# Patient Record
Sex: Female | Born: 1995
Health system: Southern US, Community
[De-identification: ages and names within clinical notes are randomized; demographics above are authoritative.]

## PROBLEM LIST (undated history)

## (undated) DIAGNOSIS — I10 Essential (primary) hypertension: Secondary | ICD-10-CM

## (undated) DIAGNOSIS — J302 Other seasonal allergic rhinitis: Secondary | ICD-10-CM

## (undated) DIAGNOSIS — J45909 Unspecified asthma, uncomplicated: Secondary | ICD-10-CM

## (undated) DIAGNOSIS — E669 Obesity, unspecified: Secondary | ICD-10-CM

---
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
## 2015-02-14 ENCOUNTER — Emergency Department (HOSPITAL_BASED_OUTPATIENT_CLINIC_OR_DEPARTMENT_OTHER)
Admission: EM | Admit: 2015-02-14 | Discharge: 2015-02-14 | Disposition: A | Discharge status: Home / Self Care | Payer: Medicaid Other | Attending: Emergency Medicine | Admitting: Emergency Medicine

## 2015-02-14 ENCOUNTER — Encounter (HOSPITAL_BASED_OUTPATIENT_CLINIC_OR_DEPARTMENT_OTHER): Payer: Self-pay

## 2015-02-14 DIAGNOSIS — E669 Obesity, unspecified: Secondary | ICD-10-CM | POA: Insufficient documentation

## 2015-02-14 DIAGNOSIS — Z792 Long term (current) use of antibiotics: Secondary | ICD-10-CM | POA: Insufficient documentation

## 2015-02-14 DIAGNOSIS — Z79899 Other long term (current) drug therapy: Secondary | ICD-10-CM | POA: Diagnosis not present

## 2015-02-14 DIAGNOSIS — M549 Dorsalgia, unspecified: Secondary | ICD-10-CM | POA: Diagnosis present

## 2015-02-14 DIAGNOSIS — J45909 Unspecified asthma, uncomplicated: Secondary | ICD-10-CM | POA: Insufficient documentation

## 2015-02-14 DIAGNOSIS — M25511 Pain in right shoulder: Secondary | ICD-10-CM | POA: Diagnosis not present

## 2015-02-14 DIAGNOSIS — M542 Cervicalgia: Secondary | ICD-10-CM | POA: Insufficient documentation

## 2015-02-14 DIAGNOSIS — M791 Myalgia, unspecified site: Secondary | ICD-10-CM

## 2015-02-14 MED ORDER — IBUPROFEN 800 MG PO TABS
800.0000 mg | ORAL_TABLET | Freq: Once | ORAL | Status: AC
  Administered 2015-02-14: 800 mg via ORAL
  Filled 2015-02-14: qty 1

## 2015-02-14 MED ORDER — METHOCARBAMOL 500 MG PO TABS: 500.0000 mg | ORAL_TABLET | Freq: Three times a day (TID) | ORAL | Status: DC | PRN

## 2015-02-14 MED ORDER — TRAMADOL HCL 50 MG PO TABS: 50.0000 mg | ORAL_TABLET | Freq: Four times a day (QID) | ORAL | Status: DC | PRN

## 2015-02-14 MED ORDER — CYCLOBENZAPRINE HCL 10 MG PO TABS
5.0000 mg | ORAL_TABLET | Freq: Once | ORAL | Status: AC
  Administered 2015-02-14: 5 mg via ORAL
  Filled 2015-02-14: qty 1

## 2015-02-14 MED ORDER — NAPROXEN 500 MG PO TABS: 500.0000 mg | ORAL_TABLET | Freq: Two times a day (BID) | ORAL | Status: DC

## 2015-02-14 NOTE — Discharge Instructions (Signed)
Your exam suggests some muscle pain in your neck and shoulder.  Prescriptions for anti-inflammatory and muscle relaxants.  Apply ice over a thin towel for 20-30 minutes on the sore spots after activity that worsens your symptoms  Tramadol, pain medicine to take at home at night. Do not take if working or driving.  Okay to continue current work. Recheck with your physician, or occupational health through your employer if your symptoms persist or worsen.

## 2015-02-14 NOTE — ED Notes (Signed)
Patient has taken tylenol and advil but no relief, used icy hot

## 2015-02-14 NOTE — ED Notes (Signed)
C/o upper back pain x 2 days-denies injury-steady gait to triage-NAD

## 2015-02-14 NOTE — ED Provider Notes (Signed)
CSN: [id number]     Arrival date & time 02/14/15  1110 History   First MD Initiated Contact with Patient 02/14/15 1120     Chief Complaint  Patient presents with  . Back Pain      HPI  Patient presents for evaluation of neck and shoulder pain. Started doing a new job at [employer] a week ago. Her right shoulder and neck are sore after work she presents here. No history of chronic neck or back pain. No fall injury or trauma. She is working as a Conservation officer, nature.  Past Medical History  Diagnosis Date  . Asthma   . Seasonal allergies   . Obesity    History reviewed. No pertinent past surgical history. No family history on file. Social History  Substance Use Topics  . Smoking status: Never Smoker   . Smokeless tobacco: None  . Alcohol Use: No   OB History    No data available     Review of Systems  Constitutional: Negative for fever, chills, diaphoresis, appetite change and fatigue.  HENT: Negative for mouth sores, sore throat and trouble swallowing.   Eyes: Negative for visual disturbance.  Respiratory: Negative for cough, chest tightness, shortness of breath and wheezing.   Cardiovascular: Negative for chest pain.  Gastrointestinal: Negative for nausea, vomiting, abdominal pain, diarrhea and abdominal distention.  Endocrine: Negative for polydipsia, polyphagia and polyuria.  Genitourinary: Negative for dysuria, frequency and hematuria.  Musculoskeletal: Negative for gait problem.       Right neck and shoulder pain.  Skin: Negative for color change, pallor and rash.  Neurological: Negative for dizziness, syncope, light-headedness and headaches.  Hematological: Does not bruise/bleed easily.  Psychiatric/Behavioral: Negative for behavioral problems and confusion.      Allergies  Augmentin  Home Medications   Prior to Admission medications   Medication Sig Start Date End Date Taking? Authorizing Provider  acetaminophen-codeine (TYLENOL #3) 300-30 MG per tablet Take 1 tablet by  mouth every 4 (four) hours as needed. For pain    Historical Provider, MD  albuterol (PROVENTIL HFA;VENTOLIN HFA) 108 (90 BASE) MCG/ACT inhaler Inhale 2 puffs into the lungs every 6 (six) hours as needed. For shortness of breath and wheezing    Historical Provider, MD  azithromycin (ZITHROMAX Z-PAK) 250 MG tablet Take 1 tablet (250 mg total) by mouth daily. 04/25/13   Fayrene Helper, PA-C  diphenhydrAMINE (BENADRYL) 25 mg capsule Take 1 capsule (25 mg total) by mouth every 6 (six) hours as needed (take with compazine for migraine headache). 02/11/13   John-Adam Bonk, MD  famotidine (PEPCID) 20 MG tablet Take 1 tablet (20 mg total) by mouth 2 (two) times daily. 07/12/11 07/11/12  Vanetta Mulders, MD  famotidine (PEPCID) 20 MG tablet Take 1 tablet (20 mg total) by mouth 2 (two) times daily. 10/06/12   Carleene Cooper, MD  fexofenadine (ALLEGRA) 180 MG tablet Take 180 mg by mouth daily.    Historical Provider, MD  hydrochlorothiazide (HYDRODIURIL) 25 MG tablet Take 1 tablet (25 mg total) by mouth daily. 10/06/12   Carleene Cooper, MD  ibuprofen (ADVIL,MOTRIN) 600 MG tablet Take 1 tablet (600 mg total) by mouth every 6 (six) hours as needed for pain. 08/23/12   [name], MD  ibuprofen (ADVIL,MOTRIN) 600 MG tablet Take 1 tablet (600 mg total) by mouth every 6 (six) hours as needed for pain. 02/11/13   John-Adam Bonk, MD  lidocaine (XYLOCAINE) 2 % solution Take 20 mLs by mouth as needed for pain. 08/23/12   Ankit  Rhunette Croft, MD  loperamide (IMODIUM) 2 MG capsule Take 4 mg by mouth daily.    Historical Provider, MD  methocarbamol (ROBAXIN) 500 MG tablet Take 1 tablet (500 mg total) by mouth 3 (three) times daily between meals as needed. 02/14/15   Rolland Porter, MD  naproxen (NAPROSYN) 500 MG tablet Take 1 tablet (500 mg total) by mouth 2 (two) times daily. 02/14/15   Rolland Porter, MD  oxyCODONE-acetaminophen (PERCOCET/ROXICET) 5-325 MG per tablet Take 1 tablet by mouth every 6 (six) hours as needed for moderate pain or severe  pain. 06/21/13   Purvis Sheffield, MD  predniSONE (DELTASONE) 20 MG tablet 3 tabs po day one, then 2 tabs daily x 4 days 04/25/13   Fayrene Helper, PA-C  prochlorperazine (COMPAZINE) 10 MG tablet Take 1 tablet (10 mg total) by mouth 2 (two) times daily as needed (for migraine headache). 02/11/13   John-Adam Bonk, MD  promethazine (PHENERGAN) 25 MG tablet Take 25 mg by mouth every 6 (six) hours as needed. For nausea    Historical Provider, MD  ranitidine (ZANTAC) 150 MG capsule Take 1 capsule (150 mg total) by mouth daily. 06/21/13   Purvis Sheffield, MD  traMADol (ULTRAM) 50 MG tablet Take 1 tablet (50 mg total) by mouth every 6 (six) hours as needed. 02/14/15   Rolland Porter, MD   BP 157/112 mmHg  Pulse 75  Temp(Src) 98.4 F (36.9 C) (Oral)  Resp 20  Ht  (1.651 m)  Wt 336 lb (152.409 kg)  BMI 55.91 kg/m2  SpO2 99% Physical Exam  Constitutional: She is oriented to person, place, and time. She appears well-developed and well-nourished. No distress.  HENT:  Head: Normocephalic.  Eyes: Conjunctivae are normal. Pupils are equal, round, and reactive to light. No scleral icterus.  Neck: Normal range of motion. Neck supple. No thyromegaly present.  Cardiovascular: Normal rate and regular rhythm.  Exam reveals no gallop and no friction rub.   No murmur heard. Pulmonary/Chest: Effort normal and breath sounds normal. No respiratory distress. She has no wheezes. She has no rales.  Abdominal: Soft. Bowel sounds are normal. She exhibits no distension. There is no tenderness. There is no rebound.  Musculoskeletal: Normal range of motion.       Arms: Tenderness along the supraspinatus fossa. No pain over the rotator cuff tendons. With internal and external rotation or symptoms reproduced in the musculature, but not tendons.  She has some pain with trapezius function.  Neurological: She is alert and oriented to person, place, and time.  Normal strength and sensation to the bilateral upper extremities. No  radicular pattern to her pain. Normal sensation distally. Normal capillary refill. Does not lose her pulse with overhead lifting of the right arm.  Skin: Skin is warm and dry. No rash noted.  Psychiatric: She has a normal mood and affect. Her behavior is normal.    ED Course  Procedures (including critical care time) Labs Review Labs Reviewed - No data to display  Imaging Review No results found. I have personally reviewed and evaluated these images and lab results as part of my medical decision-making.   EKG Interpretation None      MDM   Final diagnoses:  Muscular pain    Plan is ice, naproxen, Robaxin. I encouraged her to continue working. Ice at the end of the day over a thin towel with painful. She is employed at [employer].  I have asked her to talk with her supervisor if her symptoms persist regarding occupational  health or retraining.    Rolland Porter, MD 02/14/15 1145

## 2015-03-01 ENCOUNTER — Encounter (HOSPITAL_BASED_OUTPATIENT_CLINIC_OR_DEPARTMENT_OTHER): Payer: Self-pay | Admitting: *Deleted

## 2015-03-01 ENCOUNTER — Emergency Department (HOSPITAL_BASED_OUTPATIENT_CLINIC_OR_DEPARTMENT_OTHER): Payer: Medicaid Other

## 2015-03-01 ENCOUNTER — Emergency Department (HOSPITAL_BASED_OUTPATIENT_CLINIC_OR_DEPARTMENT_OTHER)
Admission: EM | Admit: 2015-03-01 | Discharge: 2015-03-01 | Disposition: A | Discharge status: Home / Self Care | Payer: Medicaid Other | Attending: Emergency Medicine | Admitting: Emergency Medicine

## 2015-03-01 DIAGNOSIS — R1110 Vomiting, unspecified: Secondary | ICD-10-CM | POA: Diagnosis not present

## 2015-03-01 DIAGNOSIS — Z79899 Other long term (current) drug therapy: Secondary | ICD-10-CM | POA: Insufficient documentation

## 2015-03-01 DIAGNOSIS — J45909 Unspecified asthma, uncomplicated: Secondary | ICD-10-CM | POA: Diagnosis not present

## 2015-03-01 DIAGNOSIS — R1033 Periumbilical pain: Secondary | ICD-10-CM | POA: Insufficient documentation

## 2015-03-01 DIAGNOSIS — R109 Unspecified abdominal pain: Secondary | ICD-10-CM | POA: Diagnosis present

## 2015-03-01 LAB — CBC WITH DIFFERENTIAL/PLATELET
BASOS ABS: 0 10*3/uL (ref 0.0–0.1)
BASOS PCT: 0 % (ref 0–1)
EOS ABS: 0.2 10*3/uL (ref 0.0–0.7)
EOS PCT: 3 % (ref 0–5)
HCT: 41.2 % (ref 36.0–46.0)
Hemoglobin: 14 g/dL (ref 12.0–15.0)
Lymphocytes Relative: 24 % (ref 12–46)
Lymphs Abs: 2.2 10*3/uL (ref 0.7–4.0)
MCH: 29 pg (ref 26.0–34.0)
MCHC: 34 g/dL (ref 30.0–36.0)
MCV: 85.5 fL (ref 78.0–100.0)
Monocytes Absolute: 0.6 10*3/uL (ref 0.1–1.0)
Monocytes Relative: 7 % (ref 3–12)
NEUTROS PCT: 66 % (ref 43–77)
Neutro Abs: 5.9 10*3/uL (ref 1.7–7.7)
PLATELETS: 260 10*3/uL (ref 150–400)
RBC: 4.82 MIL/uL (ref 3.87–5.11)
RDW: 12 % (ref 11.5–15.5)
WBC: 8.9 10*3/uL (ref 4.0–10.5)

## 2015-03-01 LAB — LIPASE, BLOOD: LIPASE: 20 U/L — AB (ref 22–51)

## 2015-03-01 LAB — COMPREHENSIVE METABOLIC PANEL
ALK PHOS: 35 U/L — AB (ref 38–126)
ALT: 29 U/L (ref 14–54)
ANION GAP: 8 (ref 5–15)
AST: 21 U/L (ref 15–41)
Albumin: 3.8 g/dL (ref 3.5–5.0)
BUN: 15 mg/dL (ref 6–20)
CALCIUM: 9 mg/dL (ref 8.9–10.3)
CO2: 26 mmol/L (ref 22–32)
CREATININE: 0.73 mg/dL (ref 0.44–1.00)
Chloride: 105 mmol/L (ref 101–111)
Glucose, Bld: 107 mg/dL — ABNORMAL HIGH (ref 65–99)
Potassium: 3.9 mmol/L (ref 3.5–5.1)
Sodium: 139 mmol/L (ref 135–145)
TOTAL PROTEIN: 7.4 g/dL (ref 6.5–8.1)
Total Bilirubin: 0.5 mg/dL (ref 0.3–1.2)

## 2015-03-01 LAB — URINALYSIS, ROUTINE W REFLEX MICROSCOPIC
BILIRUBIN URINE: NEGATIVE
GLUCOSE, UA: NEGATIVE mg/dL
HGB URINE DIPSTICK: NEGATIVE
KETONES UR: NEGATIVE mg/dL
Leukocytes, UA: NEGATIVE
Nitrite: NEGATIVE
PH: 8.5 — AB (ref 5.0–8.0)
Protein, ur: NEGATIVE mg/dL
Specific Gravity, Urine: 1.041 — ABNORMAL HIGH (ref 1.005–1.030)
Urobilinogen, UA: 1 mg/dL (ref 0.0–1.0)

## 2015-03-01 LAB — PREGNANCY, URINE: Preg Test, Ur: NEGATIVE

## 2015-03-01 MED ORDER — SODIUM CHLORIDE 0.9 % IV BOLUS (SEPSIS)
1000.0000 mL | Freq: Once | INTRAVENOUS | Status: AC
  Administered 2015-03-01: 1000 mL via INTRAVENOUS

## 2015-03-01 MED ORDER — ONDANSETRON HCL 4 MG/2ML IJ SOLN
4.0000 mg | Freq: Once | INTRAMUSCULAR | Status: AC
  Administered 2015-03-01: 4 mg via INTRAVENOUS
  Filled 2015-03-01: qty 2

## 2015-03-01 MED ORDER — IBUPROFEN 600 MG PO TABS: 600.0000 mg | ORAL_TABLET | Freq: Four times a day (QID) | ORAL | Status: DC | PRN

## 2015-03-01 MED ORDER — KETOROLAC TROMETHAMINE 30 MG/ML IJ SOLN
30.0000 mg | Freq: Once | INTRAMUSCULAR | Status: AC
  Administered 2015-03-01: 30 mg via INTRAVENOUS
  Filled 2015-03-01: qty 1

## 2015-03-01 NOTE — Discharge Instructions (Signed)
Ibuprofen 600 mg every 6 hours as needed for pain.  Follow-up with your primary Dr. if not improving in the next week, and return to the ER if symptoms significantly worsen or change.   Abdominal Pain Many things can cause abdominal pain. Usually, abdominal pain is not caused by a disease and will improve without treatment. It can often be observed and treated at home. Your health care provider will do a physical exam and possibly order blood tests and X-rays to help determine the seriousness of your pain. However, in many cases, more time must pass before a clear cause of the pain can be found. Before that point, your health care provider may not know if you need more testing or further treatment. HOME CARE INSTRUCTIONS  Monitor your abdominal pain for any changes. The following actions may help to alleviate any discomfort you are experiencing:  Only take over-the-counter or prescription medicines as directed by your health care provider.  Do not take laxatives unless directed to do so by your health care provider.  Try a clear liquid diet (broth, tea, or water) as directed by your health care provider. Slowly move to a bland diet as tolerated. SEEK MEDICAL CARE IF:  You have unexplained abdominal pain.  You have abdominal pain associated with nausea or diarrhea.  You have pain when you urinate or have a bowel movement.  You experience abdominal pain that wakes you in the night.  You have abdominal pain that is worsened or improved by eating food.  You have abdominal pain that is worsened with eating fatty foods.  You have a fever. SEEK IMMEDIATE MEDICAL CARE IF:   Your pain does not go away within 2 hours.  You keep throwing up (vomiting).  Your pain is felt only in portions of the abdomen, such as the right side or the left lower portion of the abdomen.  You pass bloody or black tarry stools. MAKE SURE YOU:  Understand these instructions.   Will watch your condition.    Will get help right away if you are not doing well or get worse.  Document Released: 03/26/2005 Document Revised: 06/21/2013 Document Reviewed: 02/23/2013 [facility] Patient Information 2015 [place], [place]. This information is not intended to replace advice given to you by your health care provider. Make sure you discuss any questions you have with your health care provider.

## 2015-03-01 NOTE — ED Notes (Signed)
Patient transported to Ultrasound 

## 2015-03-01 NOTE — ED Notes (Signed)
Patient states she developed crampy abdominal pain this morning around 0200, which is associated with small amounts of watery diarrhea x 7.  Large amount of vomiting with the last episode of diarrhea.  Describes the pain as a constant crampy pain with intermittent sharpe pains.

## 2015-03-01 NOTE — ED Provider Notes (Signed)
CSN: [id number]     Arrival date & time 03/01/15  1022 History   First MD Initiated Contact with Patient 03/01/15 1049     Chief Complaint  Patient presents with  . Abdominal Pain     (Consider location/radiation/quality/duration/timing/severity/associated sxs/prior Treatment) HPI Comments: Patient is a 19 year old morbidly obese female who presents with complaints of abdominal cramping, diarrhea, and vomiting that started at approximately 2 AM and woke her from sleep. All has been nonbloody and nonbilious. She reports sharp pains across her upper abdomen. She denies any fevers or chills.  Patient is a 19 y.o. female presenting with abdominal pain. The history is provided by the patient.  Abdominal Pain Pain location:  Epigastric Pain quality: cramping   Pain radiates to:  Does not radiate Pain severity:  Severe Onset quality:  Sudden Duration:  8 hours Timing:  Constant Progression:  Worsening Chronicity:  New Relieved by:  Nothing Worsened by:  Nothing tried Ineffective treatments:  None tried Associated symptoms: vomiting   Associated symptoms: no chills, no fever, no vaginal bleeding and no vaginal discharge     Past Medical History  Diagnosis Date  . Asthma   . Seasonal allergies   . Obesity    History reviewed. No pertinent past surgical history. No family history on file. Social History  Substance Use Topics  . Smoking status: Never Smoker   . Smokeless tobacco: None  . Alcohol Use: No   OB History    No data available     Review of Systems  Constitutional: Negative for fever and chills.  Gastrointestinal: Positive for vomiting and abdominal pain.  Genitourinary: Negative for vaginal bleeding and vaginal discharge.  All other systems reviewed and are negative.     Allergies  Augmentin  Home Medications   Prior to Admission medications   Medication Sig Start Date End Date Taking? Authorizing Provider  albuterol (PROVENTIL HFA;VENTOLIN HFA) 108 (90  BASE) MCG/ACT inhaler Inhale 2 puffs into the lungs every 6 (six) hours as needed. For shortness of breath and wheezing   Yes Historical Provider, MD  diphenhydrAMINE (BENADRYL) 25 mg capsule Take 1 capsule (25 mg total) by mouth every 6 (six) hours as needed (take with compazine for migraine headache). 02/11/13  Yes John-Adam Bonk, MD  methocarbamol (ROBAXIN) 500 MG tablet Take 1 tablet (500 mg total) by mouth 3 (three) times daily between meals as needed. 02/14/15  Yes Rolland Porter, MD  naproxen (NAPROSYN) 500 MG tablet Take 1 tablet (500 mg total) by mouth 2 (two) times daily. 02/14/15  Yes Rolland Porter, MD  traMADol (ULTRAM) 50 MG tablet Take 1 tablet (50 mg total) by mouth every 6 (six) hours as needed. 02/14/15  Yes Rolland Porter, MD  acetaminophen-codeine (TYLENOL #3) 300-30 MG per tablet Take 1 tablet by mouth every 4 (four) hours as needed. For pain    Historical Provider, MD  azithromycin (ZITHROMAX Z-PAK) 250 MG tablet Take 1 tablet (250 mg total) by mouth daily. 04/25/13   Fayrene Helper, PA-C  famotidine (PEPCID) 20 MG tablet Take 1 tablet (20 mg total) by mouth 2 (two) times daily. 07/12/11 07/11/12  Vanetta Mulders, MD  famotidine (PEPCID) 20 MG tablet Take 1 tablet (20 mg total) by mouth 2 (two) times daily. 10/06/12   Carleene Cooper, MD  fexofenadine (ALLEGRA) 180 MG tablet Take 180 mg by mouth daily.    Historical Provider, MD  hydrochlorothiazide (HYDRODIURIL) 25 MG tablet Take 1 tablet (25 mg total) by mouth daily. 10/06/12  Carleene Cooper, MD  ibuprofen (ADVIL,MOTRIN) 600 MG tablet Take 1 tablet (600 mg total) by mouth every 6 (six) hours as needed for pain. 08/23/12   [name], MD  ibuprofen (ADVIL,MOTRIN) 600 MG tablet Take 1 tablet (600 mg total) by mouth every 6 (six) hours as needed for pain. 02/11/13   John-Adam Bonk, MD  lidocaine (XYLOCAINE) 2 % solution Take 20 mLs by mouth as needed for pain. 08/23/12   [name], MD  loperamide (IMODIUM) 2 MG capsule Take 4 mg by mouth daily.     Historical Provider, MD  oxyCODONE-acetaminophen (PERCOCET/ROXICET) 5-325 MG per tablet Take 1 tablet by mouth every 6 (six) hours as needed for moderate pain or severe pain. 06/21/13   Purvis Sheffield, MD  predniSONE (DELTASONE) 20 MG tablet 3 tabs po day one, then 2 tabs daily x 4 days 04/25/13   Fayrene Helper, PA-C  prochlorperazine (COMPAZINE) 10 MG tablet Take 1 tablet (10 mg total) by mouth 2 (two) times daily as needed (for migraine headache). 02/11/13   John-Adam Bonk, MD  promethazine (PHENERGAN) 25 MG tablet Take 25 mg by mouth every 6 (six) hours as needed. For nausea    Historical Provider, MD  ranitidine (ZANTAC) 150 MG capsule Take 1 capsule (150 mg total) by mouth daily. 06/21/13   Purvis Sheffield, MD   BP 97/52 mmHg  Pulse 71  Temp(Src) 98.3 F (36.8 C) (Oral)  Resp 20  Ht  (1.651 m)  Wt 336 lb (152.409 kg)  BMI 55.91 kg/m2  SpO2 100% Physical Exam  Constitutional: She is oriented to person, place, and time. She appears well-developed and well-nourished. No distress.  HENT:  Head: Normocephalic and atraumatic.  Neck: Normal range of motion. Neck supple.  Cardiovascular: Normal rate and regular rhythm.  Exam reveals no gallop and no friction rub.   No murmur heard. Pulmonary/Chest: Effort normal and breath sounds normal. No respiratory distress. She has no wheezes.  Abdominal: Soft. Bowel sounds are normal. She exhibits no distension and no mass. There is tenderness. There is no rebound and no guarding.  There is tenderness to palpation in the epigastric region.  Musculoskeletal: Normal range of motion.  Neurological: She is alert and oriented to person, place, and time.  Skin: Skin is warm and dry. She is not diaphoretic.  Nursing note and vitals reviewed.   ED Course  Procedures (including critical care time) Labs Review Labs Reviewed  URINALYSIS, ROUTINE W REFLEX MICROSCOPIC (NOT AT [facility])  PREGNANCY, URINE  COMPREHENSIVE METABOLIC PANEL  LIPASE, BLOOD  CBC  WITH DIFFERENTIAL/PLATELET    Imaging Review No results found. I have personally reviewed and evaluated these images and lab results as part of my medical decision-making.   EKG Interpretation None      MDM   Final diagnoses:  None    Workup reveals unremarkable laboratory studies and negative ultrasound. Suspect her symptoms are likely viral in nature. She will be discharged to home with instructions to return as needed if she worsens or symptoms change.    Geoffery Lyons, MD 03/01/15 1316

## 2015-07-16 ENCOUNTER — Encounter (HOSPITAL_BASED_OUTPATIENT_CLINIC_OR_DEPARTMENT_OTHER): Payer: Self-pay | Admitting: *Deleted

## 2015-07-16 ENCOUNTER — Emergency Department (HOSPITAL_BASED_OUTPATIENT_CLINIC_OR_DEPARTMENT_OTHER)
Admission: EM | Admit: 2015-07-16 | Discharge: 2015-07-16 | Discharge status: Against Medical Advice | Payer: Medicaid Other | Attending: Emergency Medicine | Admitting: Emergency Medicine

## 2015-07-16 DIAGNOSIS — E669 Obesity, unspecified: Secondary | ICD-10-CM | POA: Diagnosis not present

## 2015-07-16 DIAGNOSIS — R109 Unspecified abdominal pain: Secondary | ICD-10-CM | POA: Diagnosis not present

## 2015-07-16 DIAGNOSIS — J45909 Unspecified asthma, uncomplicated: Secondary | ICD-10-CM | POA: Insufficient documentation

## 2015-07-16 DIAGNOSIS — Z3202 Encounter for pregnancy test, result negative: Secondary | ICD-10-CM | POA: Diagnosis not present

## 2015-07-16 LAB — PREGNANCY, URINE: PREG TEST UR: NEGATIVE

## 2015-07-16 LAB — URINALYSIS, ROUTINE W REFLEX MICROSCOPIC
BILIRUBIN URINE: NEGATIVE
Glucose, UA: NEGATIVE mg/dL
Ketones, ur: 15 mg/dL — AB
Leukocytes, UA: NEGATIVE
NITRITE: NEGATIVE
PH: 5.5 (ref 5.0–8.0)
PROTEIN: NEGATIVE mg/dL
Specific Gravity, Urine: 1.028 (ref 1.005–1.030)

## 2015-07-16 LAB — URINE MICROSCOPIC-ADD ON

## 2015-07-16 NOTE — ED Notes (Signed)
Mid abdominal pain x 2 days. Feels tight. Gas and burping with no relief. Hard to have a bowel movement but when she does it is diarrhea.

## 2015-08-27 ENCOUNTER — Emergency Department (HOSPITAL_BASED_OUTPATIENT_CLINIC_OR_DEPARTMENT_OTHER)
Admission: EM | Admit: 2015-08-27 | Discharge: 2015-08-27 | Disposition: A | Discharge status: Home / Self Care | Payer: Medicaid Other | Attending: Emergency Medicine | Admitting: Emergency Medicine

## 2015-08-27 ENCOUNTER — Encounter (HOSPITAL_BASED_OUTPATIENT_CLINIC_OR_DEPARTMENT_OTHER): Payer: Self-pay | Admitting: *Deleted

## 2015-08-27 DIAGNOSIS — Z791 Long term (current) use of non-steroidal anti-inflammatories (NSAID): Secondary | ICD-10-CM | POA: Diagnosis not present

## 2015-08-27 DIAGNOSIS — Z792 Long term (current) use of antibiotics: Secondary | ICD-10-CM | POA: Diagnosis not present

## 2015-08-27 DIAGNOSIS — R112 Nausea with vomiting, unspecified: Secondary | ICD-10-CM | POA: Diagnosis not present

## 2015-08-27 DIAGNOSIS — R197 Diarrhea, unspecified: Secondary | ICD-10-CM | POA: Diagnosis not present

## 2015-08-27 DIAGNOSIS — J45909 Unspecified asthma, uncomplicated: Secondary | ICD-10-CM | POA: Insufficient documentation

## 2015-08-27 DIAGNOSIS — Z79899 Other long term (current) drug therapy: Secondary | ICD-10-CM | POA: Diagnosis not present

## 2015-08-27 DIAGNOSIS — Z3202 Encounter for pregnancy test, result negative: Secondary | ICD-10-CM | POA: Diagnosis not present

## 2015-08-27 DIAGNOSIS — E669 Obesity, unspecified: Secondary | ICD-10-CM | POA: Diagnosis not present

## 2015-08-27 DIAGNOSIS — R1010 Upper abdominal pain, unspecified: Secondary | ICD-10-CM

## 2015-08-27 LAB — CBC WITH DIFFERENTIAL/PLATELET
Basophils Absolute: 0 10*3/uL (ref 0.0–0.1)
Basophils Relative: 0 %
EOS ABS: 0.2 10*3/uL (ref 0.0–0.7)
Eosinophils Relative: 3 %
HEMATOCRIT: 39.3 % (ref 36.0–46.0)
HEMOGLOBIN: 13.5 g/dL (ref 12.0–15.0)
LYMPHS ABS: 2.5 10*3/uL (ref 0.7–4.0)
LYMPHS PCT: 34 %
MCH: 29.2 pg (ref 26.0–34.0)
MCHC: 34.4 g/dL (ref 30.0–36.0)
MCV: 85.1 fL (ref 78.0–100.0)
MONOS PCT: 9 %
Monocytes Absolute: 0.6 10*3/uL (ref 0.1–1.0)
NEUTROS ABS: 3.9 10*3/uL (ref 1.7–7.7)
NEUTROS PCT: 54 %
Platelets: 254 10*3/uL (ref 150–400)
RBC: 4.62 MIL/uL (ref 3.87–5.11)
RDW: 12.3 % (ref 11.5–15.5)
WBC: 7.3 10*3/uL (ref 4.0–10.5)

## 2015-08-27 LAB — URINALYSIS, ROUTINE W REFLEX MICROSCOPIC
Bilirubin Urine: NEGATIVE
Glucose, UA: NEGATIVE mg/dL
Hgb urine dipstick: NEGATIVE
KETONES UR: NEGATIVE mg/dL
LEUKOCYTES UA: NEGATIVE
NITRITE: NEGATIVE
PH: 6 (ref 5.0–8.0)
PROTEIN: NEGATIVE mg/dL
Specific Gravity, Urine: 1.029 (ref 1.005–1.030)

## 2015-08-27 LAB — COMPREHENSIVE METABOLIC PANEL
ALK PHOS: 33 U/L — AB (ref 38–126)
ALT: 19 U/L (ref 14–54)
ANION GAP: 8 (ref 5–15)
AST: 16 U/L (ref 15–41)
Albumin: 3.5 g/dL (ref 3.5–5.0)
BUN: 11 mg/dL (ref 6–20)
CO2: 23 mmol/L (ref 22–32)
CREATININE: 0.68 mg/dL (ref 0.44–1.00)
Calcium: 8.5 mg/dL — ABNORMAL LOW (ref 8.9–10.3)
Chloride: 110 mmol/L (ref 101–111)
GFR calc non Af Amer: >60 mL/min (ref >60)
GLUCOSE: 103 mg/dL — AB (ref 65–99)
Potassium: 3.6 mmol/L (ref 3.5–5.1)
SODIUM: 141 mmol/L (ref 135–145)
Total Bilirubin: 0.4 mg/dL (ref 0.3–1.2)
Total Protein: 6.8 g/dL (ref 6.5–8.1)

## 2015-08-27 LAB — PREGNANCY, URINE: Preg Test, Ur: NEGATIVE

## 2015-08-27 LAB — LIPASE, BLOOD: LIPASE: 20 U/L (ref 11–51)

## 2015-08-27 MED ORDER — SODIUM CHLORIDE 0.9 % IV BOLUS (SEPSIS)
1000.0000 mL | Freq: Once | INTRAVENOUS | Status: AC
  Administered 2015-08-27: 1000 mL via INTRAVENOUS

## 2015-08-27 MED ORDER — MORPHINE SULFATE (PF) 4 MG/ML IV SOLN
6.0000 mg | Freq: Once | INTRAVENOUS | Status: AC
  Administered 2015-08-27: 6 mg via INTRAVENOUS
  Filled 2015-08-27 (×2): qty 2

## 2015-08-27 MED ORDER — ONDANSETRON 8 MG PO TBDP: 8.0000 mg | ORAL_TABLET | Freq: Three times a day (TID) | ORAL | Status: DC | PRN

## 2015-08-27 MED ORDER — ONDANSETRON HCL 4 MG/2ML IJ SOLN
4.0000 mg | Freq: Once | INTRAMUSCULAR | Status: AC
  Administered 2015-08-27: 4 mg via INTRAVENOUS
  Filled 2015-08-27: qty 2

## 2015-08-27 MED ORDER — OMEPRAZOLE 20 MG PO CPDR: 20.0000 mg | DELAYED_RELEASE_CAPSULE | Freq: Every day | ORAL | Status: DC

## 2015-08-27 MED FILL — OMEPRAZOLE DR 20 MG CAPSULE: 20 | 30 days supply | Qty: 30 | Fill #0

## 2015-08-27 MED FILL — ONDANSETRON ODT 8 MG TABLET: 8 | 4 days supply | Qty: 12 | Fill #0

## 2015-08-27 NOTE — Discharge Instructions (Signed)

## 2015-08-27 NOTE — ED Provider Notes (Signed)
CSN: [id number]     Arrival date & time 08/27/15  0728 History   First MD Initiated Contact with Patient 08/27/15 [phone]     Chief Complaint  Patient presents with  . Abdominal Pain      HPI Patient presents to the emergency department complaining of one week of intermittent upper abdominal pain.  She states that last night her pain seemed to worsen.  Her pain was associated with nausea vomiting and diarrhea.  She denies constipation.  No fevers or chills.  No hematemesis.  She's had recurrent symptoms like this in the past.  She's had 2 prior ultrasounds demonstrating no evidence of cholelithiasis.  She denies back pain.  She has no urinary symptoms.  No abnormal vaginal complaints.  Pain is moderate in severity.  Nothing worsens or improves her pain.   Past Medical History  Diagnosis Date  . Asthma   . Seasonal allergies   . Obesity    History reviewed. No pertinent past surgical history. No family history on file. Social History  Substance Use Topics  . Smoking status: Never Smoker   . Smokeless tobacco: None  . Alcohol Use: No   OB History    No data available     Review of Systems  All other systems reviewed and are negative.     Allergies  Augmentin  Home Medications   Prior to Admission medications   Medication Sig Start Date End Date Taking? Authorizing Provider  LOVASTATIN PO Take by mouth.   Yes Historical Provider, MD  acetaminophen-codeine (TYLENOL #3) 300-30 MG per tablet Take 1 tablet by mouth every 4 (four) hours as needed. For pain    Historical Provider, MD  albuterol (PROVENTIL HFA;VENTOLIN HFA) 108 (90 BASE) MCG/ACT inhaler Inhale 2 puffs into the lungs every 6 (six) hours as needed. For shortness of breath and wheezing    Historical Provider, MD  azithromycin (ZITHROMAX Z-PAK) 250 MG tablet Take 1 tablet (250 mg total) by mouth daily. 04/25/13   Fayrene Helper, PA-C  diphenhydrAMINE (BENADRYL) 25 mg capsule Take 1 capsule (25 mg total) by mouth every 6  (six) hours as needed (take with compazine for migraine headache). 02/11/13   John-Adam Bonk, MD  famotidine (PEPCID) 20 MG tablet Take 1 tablet (20 mg total) by mouth 2 (two) times daily. 07/12/11 07/11/12  Vanetta Mulders, MD  famotidine (PEPCID) 20 MG tablet Take 1 tablet (20 mg total) by mouth 2 (two) times daily. 10/06/12   Carleene Cooper, MD  fexofenadine (ALLEGRA) 180 MG tablet Take 180 mg by mouth daily.    Historical Provider, MD  hydrochlorothiazide (HYDRODIURIL) 25 MG tablet Take 1 tablet (25 mg total) by mouth daily. 10/06/12   Carleene Cooper, MD  ibuprofen (ADVIL,MOTRIN) 600 MG tablet Take 1 tablet (600 mg total) by mouth every 6 (six) hours as needed. 03/01/15   Geoffery Lyons, MD  lidocaine (XYLOCAINE) 2 % solution Take 20 mLs by mouth as needed for pain. 08/23/12   [name], MD  loperamide (IMODIUM) 2 MG capsule Take 4 mg by mouth daily.    Historical Provider, MD  methocarbamol (ROBAXIN) 500 MG tablet Take 1 tablet (500 mg total) by mouth 3 (three) times daily between meals as needed. 02/14/15   Rolland Porter, MD  naproxen (NAPROSYN) 500 MG tablet Take 1 tablet (500 mg total) by mouth 2 (two) times daily. 02/14/15   Rolland Porter, MD  omeprazole (PRILOSEC) 20 MG capsule Take 1 capsule (20 mg total) by mouth daily. 08/27/15  Azalia Bilis, MD  ondansetron (ZOFRAN ODT) 8 MG disintegrating tablet Take 1 tablet (8 mg total) by mouth every 8 (eight) hours as needed for nausea or vomiting. 08/27/15   Azalia Bilis, MD  oxyCODONE-acetaminophen (PERCOCET/ROXICET) 5-325 MG per tablet Take 1 tablet by mouth every 6 (six) hours as needed for moderate pain or severe pain. 06/21/13   Purvis Sheffield, MD  predniSONE (DELTASONE) 20 MG tablet 3 tabs po day one, then 2 tabs daily x 4 days 04/25/13   Fayrene Helper, PA-C  prochlorperazine (COMPAZINE) 10 MG tablet Take 1 tablet (10 mg total) by mouth 2 (two) times daily as needed (for migraine headache). 02/11/13   John-Adam Bonk, MD  promethazine (PHENERGAN) 25 MG tablet  Take 25 mg by mouth every 6 (six) hours as needed. For nausea    Historical Provider, MD  ranitidine (ZANTAC) 150 MG capsule Take 1 capsule (150 mg total) by mouth daily. 06/21/13   Purvis Sheffield, MD  traMADol (ULTRAM) 50 MG tablet Take 1 tablet (50 mg total) by mouth every 6 (six) hours as needed. 02/14/15   Rolland Porter, MD   BP 160/105 mmHg  Pulse 80  Temp(Src) 98.1 F (36.7 C) (Oral)  Resp 18  Ht  (1.651 m)  Wt 320 lb (145.151 kg)  BMI 53.25 kg/m2  SpO2 99%  LMP 07/31/2015 Physical Exam  Constitutional: She is oriented to person, place, and time. She appears well-developed and well-nourished. No distress.  HENT:  Head: Normocephalic and atraumatic.  Eyes: EOM are normal.  Neck: Normal range of motion.  Cardiovascular: Normal rate, regular rhythm and normal heart sounds.   Pulmonary/Chest: Effort normal and breath sounds normal.  Abdominal: Soft. She exhibits no distension. There is no tenderness.  Musculoskeletal: Normal range of motion.  Neurological: She is alert and oriented to person, place, and time.  Skin: Skin is warm and dry.  Psychiatric: She has a normal mood and affect. Judgment normal.  Nursing note and vitals reviewed.   ED Course  Procedures (including critical care time) Labs Review Labs Reviewed  COMPREHENSIVE METABOLIC PANEL - Abnormal; Notable for the following:    Glucose, Bld 103 (*)    Calcium 8.5 (*)    Alkaline Phosphatase 33 (*)    All other components within normal limits  CBC WITH DIFFERENTIAL/PLATELET  LIPASE, BLOOD  PREGNANCY, URINE  URINALYSIS, ROUTINE W REFLEX MICROSCOPIC (NOT AT [facility])    Imaging Review No results found. I have personally reviewed and evaluated these images and lab results as part of my medical decision-making.   EKG Interpretation None      MDM   Final diagnoses:  Upper abdominal pain    Recurrent upper abdominal pain.  Patient has had 2 ultrasounds the past several years demonstrate no  cholelithiasis.  I do not think she needs repeat imaging today.  Her symptoms are controlled in the emergency department.  Her labs and urine are without significant abnormality.  Patient be placed on Prilosec daily.  She will need GI follow-up as she may benefit from endoscopy given her recurrent upper abdominal discomfort.  Some this may represent IBS-like symptoms or gastritis.  She understands to return to the ER for new or worsening symptoms    Azalia Bilis, MD 08/27/15 [phone]

## 2015-08-27 NOTE — ED Notes (Signed)
Patient states she has a one week history of mid abdominal pain.  States last night, she developed worse abdominal pain which is associated with nausea, vomiting and diarrhea.

## 2016-01-23 ENCOUNTER — Emergency Department (HOSPITAL_BASED_OUTPATIENT_CLINIC_OR_DEPARTMENT_OTHER)
Admission: EM | Admit: 2016-01-23 | Discharge: 2016-01-23 | Disposition: A | Discharge status: Home / Self Care | Payer: Medicaid Other | Attending: Emergency Medicine | Admitting: Emergency Medicine

## 2016-01-23 ENCOUNTER — Encounter (HOSPITAL_BASED_OUTPATIENT_CLINIC_OR_DEPARTMENT_OTHER): Payer: Self-pay

## 2016-01-23 DIAGNOSIS — M545 Low back pain, unspecified: Secondary | ICD-10-CM

## 2016-01-23 MED ORDER — IBUPROFEN 800 MG PO TABS: 800.0000 mg | ORAL_TABLET | Freq: Three times a day (TID) | ORAL | 0 refills | Status: DC | PRN

## 2016-01-23 MED ORDER — METHOCARBAMOL 500 MG PO TABS: 500.0000 mg | ORAL_TABLET | Freq: Four times a day (QID) | ORAL | 0 refills | Status: DC | PRN

## 2016-01-23 MED FILL — METHOCARBAMOL 500 MG TABLET: 500 | 4 days supply | Qty: 20 | Fill #0

## 2016-01-23 MED FILL — IBUPROFEN 800 MG TABLET: 800 | 7 days supply | Qty: 21 | Fill #0

## 2016-01-23 NOTE — Discharge Instructions (Signed)
Read the information below.  Use the prescribed medication as directed.  Please discuss all new medications with your pharmacist.  You may return to the Emergency Department at any time for worsening condition or any new symptoms that concern you.   Use cold compresses on your back.  If you develop fevers, loss of control of bowel or bladder, weakness or numbness in your legs, urinary symptoms, or are unable to walk, return to the ER for a recheck.

## 2016-01-23 NOTE — ED Triage Notes (Signed)
C/o right lower back pain started this am-denies injury-NAD-steady gait

## 2016-01-23 NOTE — ED Provider Notes (Signed)
[facility]   CSN: [id number] Arrival date & time: 01/23/16  1235  First Provider Contact:  First MD Initiated Contact with Patient 01/23/16 1251        History   Chief Complaint Chief Complaint  Patient presents with  . Back Pain    HPI Elaine Miller is a 20 y.o. female.  HPI   Pt presents with right lower back pain that began this morning, is constant but worse with any movement or position change.  The pain does not radiate.  NO relief with ibuprofen.  Denies fevers, chills, abdominal pain, loss of control of bowel or bladder, weakness of numbness of the extremities, saddle anesthesia, bowel, urinary, or vaginal complaints.    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 Denies any recent known injury.  NO recent falls.   LMP July 10-15 normal and on time.    Past Medical History:  Diagnosis Date  . Asthma   . Obesity   . Seasonal allergies     Patient Active Problem List   Diagnosis Date Noted  . Reflux 06/21/2013  . Gastritis 06/21/2013    History reviewed. No pertinent surgical history.  OB History    No data available       Home Medications    Prior to Admission medications   Medication Sig Start Date End Date Taking? Authorizing Provider  famotidine (PEPCID) 20 MG tablet Take 1 tablet (20 mg total) by mouth 2 (two) times daily. 07/12/11 07/11/12  Vanetta Mulders, MD    Family History No family history on file.  Social History Social History  Substance Use Topics  . Smoking status: Never Smoker  . Smokeless tobacco: Never Used  . Alcohol use No     Allergies   Augmentin [amoxicillin-pot clavulanate]   Review of Systems Review of Systems  All other systems reviewed and are negative.    Physical Exam Updated Vital Signs BP 124/68 (BP Location: Left Arm)   Pulse 74   Temp 98.7 F (37.1 C) (Oral)   Resp 20   Ht  (1.651 m)   Wt 132.5 kg   LMP 01/07/2016   SpO2 99%   BMI 48.59 kg/m    Physical Exam  Constitutional: She appears well-developed and well-nourished. No distress.  HENT:  Head: Normocephalic and atraumatic.  Neck: Neck supple.  Pulmonary/Chest: Effort normal.  Abdominal: Soft. She exhibits no distension and no mass. There is no tenderness. There is no rebound and no guarding.  Musculoskeletal:       Back:  Spine nontender, no crepitus, or stepoffs. Lower extremities:  Strength 5/5, sensation intact, distal pulses intact.    Gait is normal   Neurological: She is alert.  Skin: She is not diaphoretic.  Nursing note and vitals reviewed.    ED Treatments / Results  Labs (all labs ordered are listed, but only abnormal results are displayed) Labs Reviewed - No data to display  EKG  EKG Interpretation None       Radiology No results found.  Procedures Procedures (including critical care time)  Medications Ordered in ED Medications - No data to display   Initial Impression / Assessment and Plan / ED Course  I have reviewed the triage vital signs and the nursing notes.  Pertinent labs & imaging results that were available during my care of the patient were reviewed by me and considered in my medical decision making (see chart for details).  Clinical Course    Afebrile, nontoxic patient with mechanical low back pain. No red flags.  D/C with motrin, robaxin, work note, instructions for ice, PCP follow up. Discussed result, findings, treatment, and follow up  with patient.  Pt given return precautions.  Pt verbalizes understanding and agrees with plan.      Final Clinical Impressions(s) / ED Diagnoses   Final diagnoses:  Right-sided low back pain without sciatica    New Prescriptions Discharge Medication List as of 01/23/2016  1:06 PM    START taking these medications   Details  ibuprofen (ADVIL,MOTRIN) 800 MG tablet Take 1 tablet (800 mg total) by mouth every 8 (eight) hours as needed., Starting Wed 01/23/2016, Print    methocarbamol  (ROBAXIN) 500 MG tablet Take 1-2 tablets (500-1,000 mg total) by mouth every 6 (six) hours as needed for muscle spasms (and pain)., Starting Wed 01/23/2016, Print         [place], [place] 01/23/16 1321    Jacalyn Lefevre, MD 01/25/16 [phone]

## 2016-07-01 ENCOUNTER — Emergency Department (HOSPITAL_BASED_OUTPATIENT_CLINIC_OR_DEPARTMENT_OTHER): Payer: Medicaid Other

## 2016-07-01 ENCOUNTER — Encounter (HOSPITAL_BASED_OUTPATIENT_CLINIC_OR_DEPARTMENT_OTHER): Payer: Self-pay | Admitting: Emergency Medicine

## 2016-07-01 ENCOUNTER — Emergency Department (HOSPITAL_BASED_OUTPATIENT_CLINIC_OR_DEPARTMENT_OTHER)
Admission: EM | Admit: 2016-07-01 | Discharge: 2016-07-01 | Disposition: A | Discharge status: Home / Self Care | Payer: Medicaid Other | Attending: Emergency Medicine | Admitting: Emergency Medicine

## 2016-07-01 DIAGNOSIS — J4521 Mild intermittent asthma with (acute) exacerbation: Secondary | ICD-10-CM | POA: Insufficient documentation

## 2016-07-01 DIAGNOSIS — Z79899 Other long term (current) drug therapy: Secondary | ICD-10-CM | POA: Insufficient documentation

## 2016-07-01 DIAGNOSIS — B9789 Other viral agents as the cause of diseases classified elsewhere: Secondary | ICD-10-CM

## 2016-07-01 DIAGNOSIS — F17200 Nicotine dependence, unspecified, uncomplicated: Secondary | ICD-10-CM | POA: Insufficient documentation

## 2016-07-01 DIAGNOSIS — J069 Acute upper respiratory infection, unspecified: Secondary | ICD-10-CM | POA: Insufficient documentation

## 2016-07-01 MED ORDER — BENZONATATE 100 MG PO CAPS: 100.0000 mg | ORAL_CAPSULE | Freq: Three times a day (TID) | ORAL | 0 refills | Status: DC | PRN

## 2016-07-01 MED ORDER — IPRATROPIUM-ALBUTEROL 0.5-2.5 (3) MG/3ML IN SOLN
RESPIRATORY_TRACT | Status: AC
  Administered 2016-07-01: 3 mL
  Filled 2016-07-01: qty 3

## 2016-07-01 MED ORDER — PREDNISONE 20 MG PO TABS: ORAL_TABLET | ORAL | 0 refills | Status: DC

## 2016-07-01 MED ORDER — PREDNISONE 50 MG PO TABS
60.0000 mg | ORAL_TABLET | Freq: Once | ORAL | Status: AC
  Administered 2016-07-01: 60 mg via ORAL
  Filled 2016-07-01: qty 1

## 2016-07-01 MED ORDER — ALBUTEROL SULFATE (2.5 MG/3ML) 0.083% IN NEBU
INHALATION_SOLUTION | RESPIRATORY_TRACT | Status: AC
  Administered 2016-07-01: 2.5 mg
  Filled 2016-07-01: qty 3

## 2016-07-01 MED ORDER — ALBUTEROL SULFATE HFA 108 (90 BASE) MCG/ACT IN AERS
2.0000 | INHALATION_SPRAY | RESPIRATORY_TRACT | Status: DC | PRN
  Administered 2016-07-01: 2 via RESPIRATORY_TRACT
  Filled 2016-07-01: qty 6.7

## 2016-07-01 NOTE — ED Provider Notes (Signed)
[facility]   CSN: [id number] Arrival date & time: 07/01/16  1010     History   Chief Complaint Chief Complaint  Patient presents with  . Cough    HPI Elaine Miller is a 21 y.o. female.  HPI   21 year old female with history of asthma, presenting today with complaints of chest pain and shortness of breath. Patient states for the past week she has had cough productive with white sputum, the past several days she also endorses having congestion, headache, throat irritation and increased wheezing. She normally uses her rescue inhaler once every 2 weeks but for the past several days she is using it twice daily last use was this morning. 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 She denies fever, neck stiffness, pleuritic chest pain, abdominal pain, nausea vomiting or diarrhea or rash. No prior complication with asthma.  Past Medical History:  Diagnosis Date  . Asthma   . Obesity   . Seasonal allergies     Patient Active Problem List   Diagnosis Date Noted  . Reflux 06/21/2013  . Gastritis 06/21/2013    History reviewed. No pertinent surgical history.  OB History    No data available       Home Medications    Prior to Admission medications   Medication Sig Start Date End Date Taking? Authorizing Provider  famotidine (PEPCID) 20 MG tablet Take 1 tablet (20 mg total) by mouth 2 (two) times daily. 07/12/11 07/11/12  Vanetta Mulders, MD  ibuprofen (ADVIL,MOTRIN) 800 MG tablet Take 1 tablet (800 mg total) by mouth every 8 (eight) hours as needed. 01/23/16   Trixie Dredge, PA-C  methocarbamol (ROBAXIN) 500 MG tablet Take 1-2 tablets (500-1,000 mg total) by mouth every 6 (six) hours as needed for muscle spasms (and pain). 01/23/16   Trixie Dredge, PA-C    Family History No family history on file.  Social History Social History  Substance Use Topics  . Smoking status: Never Smoker  . Smokeless tobacco: Never Used  . Alcohol use No     Allergies    Augmentin [amoxicillin-pot clavulanate]   Review of Systems Review of Systems  All other systems reviewed and are negative.    Physical Exam Updated Vital Signs BP (!) 123/110   Pulse 61   Temp 98.7 F (37.1 C) (Oral)   Resp 18   Ht 5\' 5"  (1.651 m)   Wt 124.7 kg   LMP 06/24/2016   SpO2 100%   BMI 45.76 kg/m   Physical Exam  Constitutional: She appears well-developed and well-nourished. No distress.  HENT:  Head: Atraumatic.  Right Ear: External ear normal.  Left Ear: External ear normal.  Mouth/Throat: Oropharynx is clear and moist.  Eyes: Conjunctivae are normal.  Neck: Normal range of motion. Neck supple.  Cardiovascular: Normal rate and regular rhythm.   Pulmonary/Chest: She has wheezes.  Audible wheezes. Scattered rhonchi and expiratory wheezes on exam. No rales  Abdominal: Soft. There is no tenderness.  Musculoskeletal: She exhibits no edema.  Lymphadenopathy:    She has no cervical adenopathy.  Neurological: She is alert.  Skin: No rash noted.  Psychiatric: She has a normal mood and affect.  Nursing note and vitals reviewed.    ED Treatments / Results  Labs (all labs ordered are listed, but only abnormal results are displayed) Labs Reviewed - No data to display  EKG  EKG Interpretation None       Radiology Dg Chest 2 View  Result Date: 07/01/2016 CLINICAL DATA:  Cough and chest congestion for the past week with onset of wheezing 3 days ago. History of asthma. Patient is nonsmoker. EXAM: CHEST  2 VIEW COMPARISON:  Chest x-ray of April 25, 2013 FINDINGS: The lungs are adequately inflated and clear. There is no pleural effusion. The heart and pulmonary vascularity are normal. The mediastinum is normal in width. The bony thorax is unremarkable. IMPRESSION: There is no pneumonia nor other acute cardiopulmonary abnormality. Electronically Signed   By: David  [place] M.D.   On: 07/01/2016 10:48    Procedures Procedures (including critical care  time)  Medications Ordered in ED Medications - No data to display   Initial Impression / Assessment and Plan / ED Course  I have reviewed the triage vital signs and the nursing notes.  Pertinent labs & imaging results that were available during my care of the patient were reviewed by me and considered in my medical decision making (see chart for details).  Clinical Course     BP (!) 123/110   Pulse 61   Temp 98.7 F (37.1 C) (Oral)   Resp 18   Ht 5\' 5"  (1.651 m)   Wt 124.7 kg   LMP 06/24/2016   SpO2 100%   BMI 45.76 kg/m    Final Clinical Impressions(s) / ED Diagnoses   Final diagnoses:  Viral URI with cough  Mild intermittent asthma with exacerbation    New Prescriptions New Prescriptions   BENZONATATE (TESSALON) 100 MG CAPSULE    Take 1 capsule (100 mg total) by mouth 3 (three) times daily as needed for cough.   PREDNISONE (DELTASONE) 20 MG TABLET    3 tabs po day one, then 2 tabs daily x 4 days   10:27 AM Patient here with URI symptoms, and worsen asthma. She is actively wheezing. However, she is not tachycardic or tachypneic. Will obtain chest x-ray to rule out a Pneumonia. Patient treated with albuterol and Atrovent as well as steroid.  11:36 AM Fortunately her chest x-ray shows no evidence of pneumonia or other acute cardiopulmonary abnormality. No hypoxia with ambulation. Suspect viral illness causing asthma exacerbation. Patient will be discharge with albuterol inhaler, steroid taper, and cough medication.   Fayrene HelperBowie Machi Whittaker, PA-C 07/01/16 1141    Rolan BuccoMelanie Belfi, MD 07/01/16 1500

## 2016-07-01 NOTE — ED Notes (Signed)
Ambulated in hall, R/A SpO2 98-99%, HR 90-98, RR 20-26, patient states able to take full breath at this time.

## 2016-07-01 NOTE — ED Triage Notes (Signed)
C/o cough x 1 week with yellow/green sputum. Also c/o wheezing, nausea and c/p and h/a

## 2016-07-03 ENCOUNTER — Encounter (HOSPITAL_BASED_OUTPATIENT_CLINIC_OR_DEPARTMENT_OTHER): Payer: Self-pay | Admitting: Emergency Medicine

## 2016-07-03 ENCOUNTER — Emergency Department (HOSPITAL_BASED_OUTPATIENT_CLINIC_OR_DEPARTMENT_OTHER): Payer: Self-pay

## 2016-07-03 ENCOUNTER — Emergency Department (HOSPITAL_BASED_OUTPATIENT_CLINIC_OR_DEPARTMENT_OTHER)
Admission: EM | Admit: 2016-07-03 | Discharge: 2016-07-03 | Disposition: A | Discharge status: Home / Self Care | Payer: Self-pay | Attending: Emergency Medicine | Admitting: Emergency Medicine

## 2016-07-03 DIAGNOSIS — Z79899 Other long term (current) drug therapy: Secondary | ICD-10-CM | POA: Insufficient documentation

## 2016-07-03 DIAGNOSIS — F17200 Nicotine dependence, unspecified, uncomplicated: Secondary | ICD-10-CM | POA: Insufficient documentation

## 2016-07-03 DIAGNOSIS — J45901 Unspecified asthma with (acute) exacerbation: Secondary | ICD-10-CM | POA: Insufficient documentation

## 2016-07-03 MED ORDER — ALBUTEROL SULFATE (2.5 MG/3ML) 0.083% IN NEBU
2.5000 mg | INHALATION_SOLUTION | Freq: Once | RESPIRATORY_TRACT | Status: AC
  Administered 2016-07-03: 2.5 mg via RESPIRATORY_TRACT

## 2016-07-03 MED ORDER — ALBUTEROL SULFATE HFA 108 (90 BASE) MCG/ACT IN AERS: 1.0000 | INHALATION_SPRAY | Freq: Four times a day (QID) | RESPIRATORY_TRACT | 0 refills | Status: DC | PRN

## 2016-07-03 MED ORDER — ALBUTEROL SULFATE (2.5 MG/3ML) 0.083% IN NEBU
INHALATION_SOLUTION | RESPIRATORY_TRACT | Status: AC
  Administered 2016-07-03: 2.5 mg via RESPIRATORY_TRACT
  Filled 2016-07-03: qty 3

## 2016-07-03 MED ORDER — IPRATROPIUM-ALBUTEROL 0.5-2.5 (3) MG/3ML IN SOLN
3.0000 mL | Freq: Once | RESPIRATORY_TRACT | Status: AC
  Administered 2016-07-03: 3 mL via RESPIRATORY_TRACT
  Filled 2016-07-03: qty 3

## 2016-07-03 MED ORDER — IPRATROPIUM-ALBUTEROL 0.5-2.5 (3) MG/3ML IN SOLN
RESPIRATORY_TRACT | Status: AC
  Administered 2016-07-03: 3 mL via RESPIRATORY_TRACT
  Filled 2016-07-03: qty 3

## 2016-07-03 MED ORDER — PREDNISONE 20 MG PO TABS: 20.0000 mg | ORAL_TABLET | Freq: Every day | ORAL | 0 refills | Status: AC

## 2016-07-03 MED ORDER — PREDNISONE 20 MG PO TABS
20.0000 mg | ORAL_TABLET | Freq: Once | ORAL | Status: AC
  Administered 2016-07-03: 20 mg via ORAL
  Filled 2016-07-03: qty 1

## 2016-07-03 MED ORDER — IPRATROPIUM-ALBUTEROL 0.5-2.5 (3) MG/3ML IN SOLN
3.0000 mL | Freq: Once | RESPIRATORY_TRACT | Status: AC
  Administered 2016-07-03: 3 mL via RESPIRATORY_TRACT

## 2016-07-03 NOTE — ED Provider Notes (Signed)
Emergency Department Provider Note   I have reviewed the triage vital signs and the nursing notes.   HISTORY  Chief Complaint Shortness of Breath and Cough   HPI Elaine Miller is a 21 y.o. female with PMH of obesity and asthma presents to the emergency department for evaluation of continued cough and difficulty breathing. She was seen here on 07/02/15 with presumed upper respiratory tract infection and asthma flare. She responded well to albuterol and had a chest x-ray that was negative for pneumonia. She's been compliant with her steroid taper and took 40 mg this morning. She's been continuing to use her albuterol inhaler with only mild relief. She states that her cough is gotten worse and she is having pain in the center of her chest only with coughing. No fevers, chills, body aches.    Past Medical History:  Diagnosis Date  . Asthma   . Obesity   . Seasonal allergies     Patient Active Problem List   Diagnosis Date Noted  . Reflux 06/21/2013  . Gastritis 06/21/2013    History reviewed. No pertinent surgical history.  Current Outpatient Rx  . Order #: [id number] Class: Print  . Order #: [id number] Class: Print  . Order #: [id number] Class: Print  . Order #: [id number] Class: Print    Allergies Augmentin [amoxicillin-pot clavulanate]  History reviewed. No pertinent family history.  Social History Social History  Substance Use Topics  . Smoking status: Current Every Day Smoker    Years: 5.00  . Smokeless tobacco: Never Used  . Alcohol use No    Review of Systems  Constitutional: No fever/chills Eyes: No visual changes. ENT: No sore throat. Cardiovascular: Positive chest pain with coughing.  Respiratory: Positive shortness of breath. Positive cough and wheezing.  Gastrointestinal: No abdominal pain.  No nausea, no vomiting.  No diarrhea.  No constipation. Genitourinary: Negative for dysuria. Musculoskeletal: Negative for back pain. Skin: Negative for  rash. Neurological: Negative for headaches, focal weakness or numbness.  10-point ROS otherwise negative.  ____________________________________________   PHYSICAL EXAM:  VITAL SIGNS: ED Triage Vitals [07/03/16 1013]  Enc Vitals Group     BP 141/84     Pulse Rate 73     Resp 24     Temp 98 F (36.7 C)     Temp Source Oral     SpO2 100 %   Constitutional: Alert and oriented. Well appearing and in no acute distress. Eyes: Conjunctivae are normal. Head: Atraumatic. Nose: No congestion/rhinnorhea. Mouth/Throat: Mucous membranes are moist.  Oropharynx non-erythematous. Neck: No stridor.   Cardiovascular: Normal rate, regular rhythm. Good peripheral circulation. Grossly normal heart sounds.   Respiratory: Increased respiratory effort.  No retractions. Lungs with diffuse expiratory wheezing throughout.  Gastrointestinal: Soft and nontender. No distention.  Musculoskeletal: No lower extremity tenderness nor edema. No gross deformities of extremities. Neurologic:  Normal speech and language. No gross focal neurologic deficits are appreciated.  Skin:  Skin is warm, dry and intact. No rash noted. ____________________________________________  RADIOLOGY  Dg Chest 2 View  Result Date: 07/03/2016 CLINICAL DATA:  Cough and wheezing. EXAM: CHEST  2 VIEW COMPARISON:  07/01/2016 . FINDINGS: Mediastinum and hilar structures normal. Lungs are clear. Heart size normal. No pleural effusion or pneumothorax. No acute bony abnormality . IMPRESSION: No acute cardiopulmonary disease. Electronically Signed   By: Maisie Fushomas  Register   On: 07/03/2016 10:50    ____________________________________________   PROCEDURES  Procedure(s) performed:   Procedures  None ____________________________________________   INITIAL IMPRESSION / ASSESSMENT AND  PLAN / ED COURSE  Pertinent labs & imaging results that were available during my care of the patient were reviewed by me and considered in my medical  decision making (see chart for details).  Patient resents to the urgency department for evaluation of continued URI symptoms and dyspnea. The patient has slightly asymmetric lung exam with diffuse expiratory wheezing. Chest x-ray 2 days ago showed no pneumonia. Plan for repeat film with worsening coughing and dyspnea. No hypoxemia here. Patient given DuoNeb on arrival. Gave additional 20 mg of prednisone given that she was on a taper from her previous ED presentation. Will reassess.   10:49 AM Patient is ambulatory to x-ray without difficulty. Feeling better after initial nebulizer. Will give additional neb and reassess after chest x-ray.  At this time, I do not feel there is any life-threatening condition present. I have reviewed and discussed all results (EKG, imaging, lab, urine as appropriate), exam findings with patient. I have reviewed nursing notes and appropriate previous records.  I feel the patient is safe to be discharged home without further emergent workup. Discussed usual and customary return precautions. Patient and family (if present) verbalize understanding and are comfortable with this plan.  Patient will follow-up with their primary care provider. If they do not have a primary care provider, information for follow-up has been provided to them. All questions have been answered.  ____________________________________________  FINAL CLINICAL IMPRESSION(S) / ED DIAGNOSES  Final diagnoses:  Mild asthma with exacerbation, unspecified whether persistent     MEDICATIONS GIVEN DURING THIS VISIT:  Medications  albuterol (PROVENTIL) (2.5 MG/3ML) 0.083% nebulizer solution 2.5 mg (2.5 mg Nebulization Given 07/03/16 1023)  ipratropium-albuterol (DUONEB) 0.5-2.5 (3) MG/3ML nebulizer solution 3 mL (3 mLs Nebulization Given 07/03/16 1023)  predniSONE (DELTASONE) tablet 20 mg (20 mg Oral Given 07/03/16 1030)  ipratropium-albuterol (DUONEB) 0.5-2.5 (3) MG/3ML nebulizer solution 3 mL (3 mLs  Nebulization Given 07/03/16 1114)     NEW OUTPATIENT MEDICATIONS STARTED DURING THIS VISIT:  Discharge Medication List as of 07/03/2016 11:50 AM    START taking these medications   Details  albuterol (PROVENTIL HFA;VENTOLIN HFA) 108 (90 Base) MCG/ACT inhaler Inhale 1-2 puffs into the lungs every 6 (six) hours as needed for wheezing or shortness of breath., Starting Thu 07/03/2016, Print          Note:  This document was prepared using Dragon voice recognition software and may include unintentional dictation errors.  Alona Bene, MD Emergency Medicine   Maia Plan, MD 07/04/16 [phone]

## 2016-07-03 NOTE — ED Triage Notes (Signed)
Pt states cough and SOBx 1 week, seen 2 days ago but not getting better

## 2016-07-03 NOTE — ED Notes (Signed)
RT at bedside for 2nd breathing treatment.

## 2016-07-03 NOTE — Discharge Instructions (Signed)
We believe that your symptoms are caused today by an exacerbation of your asthma.  Please take the prescribed medications and any medications that you have at home.  Follow up with your doctor as recommended.  If you develop any new or worsening symptoms, including but not limited to fever, persistent vomiting, worsening shortness of breath, or other symptoms that concern you, please return to the Emergency Department immediately. ° ° °Asthma °Asthma is a recurring condition in which the airways tighten and narrow. Asthma can make it difficult to breathe. It can cause coughing, wheezing, and shortness of breath. Asthma episodes, also called asthma attacks, range from minor to life-threatening. Asthma cannot be cured, but medicines and lifestyle changes can help control it. °CAUSES °Asthma is believed to be caused by inherited (genetic) and environmental factors, but its exact cause is unknown. Asthma may be triggered by allergens, lung infections, or irritants in the air. Asthma triggers are different for each person. Common triggers include:  °Animal dander. °Dust mites. °Cockroaches. °Pollen from trees or grass. °Mold. °Smoke. °Air pollutants such as dust, household cleaners, hair sprays, aerosol sprays, paint fumes, strong chemicals, or strong odors. °Cold air, weather changes, and winds (which increase molds and pollens in the air). °Strong emotional expressions such as crying or laughing hard. °Stress. °Certain medicines (such as aspirin) or types of drugs (such as beta-blockers). °Sulfites in foods and drinks. Foods and drinks that may contain sulfites include dried fruit, potato chips, and sparkling grape juice. °Infections or inflammatory conditions such as the flu, a cold, or an inflammation of the nasal membranes (rhinitis). °Gastroesophageal reflux disease (GERD). °Exercise or strenuous activity. °SYMPTOMS °Symptoms may occur immediately after asthma is triggered or many hours later. Symptoms  include: °Wheezing. °Excessive nighttime or early morning coughing. °Frequent or severe coughing with a common cold. °Chest tightness. °Shortness of breath. °DIAGNOSIS  °The diagnosis of asthma is made by a review of your medical history and a physical exam. Tests may also be performed. These may include: °Lung function studies. These tests show how much air you breathe in and out. °Allergy tests. °Imaging tests such as X-rays. °TREATMENT  °Asthma cannot be cured, but it can usually be controlled. Treatment involves identifying and avoiding your asthma triggers. It also involves medicines. There are 2 classes of medicine used for asthma treatment:  °Controller medicines. These prevent asthma symptoms from occurring. They are usually taken every day. °Reliever or rescue medicines. These quickly relieve asthma symptoms. They are used as needed and provide short-term relief. °Your health care provider will help you create an asthma action plan. An asthma action plan is a written plan for managing and treating your asthma attacks. It includes a list of your asthma triggers and how they may be avoided. It also includes information on when medicines should be taken and when their dosage should be changed. An action plan may also involve the use of a device called a peak flow meter. A peak flow meter measures how well the lungs are working. It helps you monitor your condition. °HOME CARE INSTRUCTIONS  °Take medicines only as directed by your health care provider. Speak with your health care provider if you have questions about how or when to take the medicines. °Use a peak flow meter as directed by your health care provider. Record and keep track of readings. °Understand and use the action plan to help minimize or stop an asthma attack without needing to seek medical care. °Control your home environment in the following   ways to help prevent asthma attacks: °Do not smoke. Avoid being exposed to secondhand smoke. °Change  your heating and air conditioning filter regularly. °Limit your use of fireplaces and wood stoves. °Get rid of pests (such as roaches and mice) and their droppings. °Throw away plants if you see mold on them. °Clean your floors and dust regularly. Use unscented cleaning products. °Try to have someone else vacuum for you regularly. Stay out of rooms while they are being vacuumed and for a short while afterward. If you vacuum, use a dust mask from a hardware store, a double-layered or microfilter vacuum cleaner bag, or a vacuum cleaner with a HEPA filter. °Replace carpet with wood, tile, or vinyl flooring. Carpet can trap dander and dust. °Use allergy-proof pillows, mattress covers, and box spring covers. °Wash bed sheets and blankets every week in hot water and dry them in a dryer. °Use blankets that are made of polyester or cotton. °Clean bathrooms and kitchens with bleach. If possible, have someone repaint the walls in these rooms with mold-resistant paint. Keep out of the rooms that are being cleaned and painted. °Wash hands frequently. °SEEK MEDICAL CARE IF:  °You have wheezing, shortness of breath, or a cough even if taking medicine to prevent attacks. °The colored mucus you cough up (sputum) is thicker than usual. °Your sputum changes from clear or white to yellow, green, gray, or bloody. °You have any problems that may be related to the medicines you are taking (such as a rash, itching, swelling, or trouble breathing). °You are using a reliever medicine more than 2-3 times per week. °Your peak flow is still at 50-79% of your personal best after following your action plan for 1 hour. °You have a fever. °SEEK IMMEDIATE MEDICAL CARE IF:  °You seem to be getting worse and are unresponsive to treatment during an asthma attack. °You are short of breath even at rest. °You get short of breath when doing very little physical activity. °You have difficulty eating, drinking, or talking due to asthma symptoms. °You  develop chest pain. °You develop a fast heartbeat. °You have a bluish color to your lips or fingernails. °You are light-headed, dizzy, or faint. °Your peak flow is less than 50% of your personal best. °MAKE SURE YOU:  °Understand these instructions. °Will watch your condition. °Will get help right away if you are not doing well or get worse. °Document Released: 06/16/2005 Document Revised: 10/31/2013 Document Reviewed: 01/13/2013 °[employer]® Patient Information ©2015 [employer]. This information is not intended to replace advice given to you by your health care provider. Make sure you discuss any questions you have with your health care provider. ° °How to Use a Nebulizer °If you have asthma or other breathing problems, you might need to breathe in (inhale) medicine. This can be done with a nebulizer. A nebulizer is a device that turns liquid medicine into a mist that you can inhale.  °There are different kinds of nebulizers. Most are small. With some, you breathe in through a mouthpiece. With others, a mask fits over your nose and mouth. Most nebulizers must be connected to a small air compressor. Air is forced through tubing from the compressor to the nebulizer. The forced air changes the liquid into a fine spray. °RISKS AND COMPLICATIONS °The nebulizer must work properly for it to help your breathing. If the nebulizer does not produce mist, or if foam comes out, this indicates that the nebulizer is not working properly. Sometimes a filter can get clogged, or   there might be a problem with the air compressor. Check the instruction booklet that came with your nebulizer. It should tell you how to fix problems or where to call for help. You should have at least one extra nebulizer at home. That way, you will always have one when you need it.  °HOW TO PREPARE BEFORE USING THE NEBULIZER °Take these steps before using the nebulizer: °Check your medicine. Make sure it has not expired and is not damaged in any way.    °Wash your hands with soap and water.   °Put all the parts of your nebulizer on a sturdy, flat surface. Make sure the tubing connects the compressor and the nebulizer. °Measure the liquid medicine according to your health care provider's instructions. Pour it into the nebulizer. °Attach the mouthpiece or mask.   °Test the nebulizer by turning it on to make sure a spray is coming out. Then, turn it off.   °HOW TO USE THE NEBULIZER °Sit down and focus on staying relaxed.   °If your nebulizer has a mask, put it over your nose and mouth. If you use a mouthpiece, put it in your mouth. Press your lips firmly around the mouthpiece. °Turn on the nebulizer.   °Breathe out.   °Some nebulizers have a finger valve. If yours does, cover up the air hole so the air gets to the nebulizer. °Once the medicine begins to mist out, take slow, deep breaths. If there is a finger valve, release it at the end of your breath. °Continue taking slow, deep breaths until the nebulizer is empty.   °Be sure to stop the machine at any point if you start coughing or if the medicine foams or bubbles. °HOW TO CLEAN THE NEBULIZER  °The nebulizer and all its parts must be kept very clean. Follow the manufacturer's instructions for cleaning. For most nebulizers, you should follow these guidelines: °Wash the nebulizer after each use. Use warm water and soap. Rinse it well. Shake the nebulizer to remove extra water. Put it on a clean towel until it is completely dry. To make sure it is dry, put the nebulizer back together. Turn on the compressor for a few minutes. This will blow air through the nebulizer.   °Do not wash the tubing or the finger valve.   °Store the nebulizer in a dust-free place.   °Inspect the filter every week. Replace it any time it looks dirty.   °Sometimes the nebulizer will need a more complete cleaning. The instruction booklet should say how often you need to do this. °SEEK MEDICAL CARE IF:  °You continue to have difficulty  breathing.   °You have trouble using the nebulizer.   °Document Released: 06/04/2009 Document Revised: 10/31/2013 Document Reviewed: 12/06/2012 °[employer]® Patient Information ©2015 [employer]. This information is not intended to replace advice given to you by your health care provider. Make sure you discuss any questions you have with your health care provider. ° °How to Use an Inhaler °Proper inhaler technique is very important. Good technique ensures that the medicine reaches the lungs. Poor technique results in depositing the medicine on the tongue and back of the throat rather than in the airways. If you do not use the inhaler with good technique, the medicine will not help you. °STEPS TO FOLLOW IF USING AN INHALER WITHOUT AN EXTENSION TUBE °Remove the cap from the inhaler. °If you are using the inhaler for the first time, you will need to prime it. Shake the inhaler for   5 seconds and release four puffs into the air, away from your face. Ask your health care provider or pharmacist if you have questions about priming your inhaler. °Shake the inhaler for 5 seconds before each breath in (inhalation). °Position the inhaler so that the top of the canister faces up. °Put your index finger on the top of the medicine canister. Your thumb supports the bottom of the inhaler. °Open your mouth. °Either place the inhaler between your teeth and place your lips tightly around the mouthpiece, or hold the inhaler 1-2 inches away from your open mouth. If you are unsure of which technique to use, ask your health care provider. °Breathe out (exhale) normally and as completely as possible. °Press the canister down with your index finger to release the medicine. °At the same time as the canister is pressed, inhale deeply and slowly until your lungs are completely filled. This should take 4-6 seconds. Keep your tongue down. °Hold the medicine in your lungs for 5-10 seconds (10 seconds is best). This helps the medicine get into the  small airways of your lungs. °Breathe out slowly, through pursed lips. Whistling is an example of pursed lips. °Wait at least 15-30 seconds between puffs. Continue with the above steps until you have taken the number of puffs your health care provider has ordered. Do not use the inhaler more than your health care provider tells you. °Replace the cap on the inhaler. °Follow the directions from your health care provider or the inhaler insert for cleaning the inhaler. °STEPS TO FOLLOW IF USING AN INHALER WITH AN EXTENSION (SPACER) °Remove the cap from the inhaler. °If you are using the inhaler for the first time, you will need to prime it. Shake the inhaler for 5 seconds and release four puffs into the air, away from your face. Ask your health care provider or pharmacist if you have questions about priming your inhaler. °Shake the inhaler for 5 seconds before each breath in (inhalation). °Place the open end of the spacer onto the mouthpiece of the inhaler. °Position the inhaler so that the top of the canister faces up and the spacer mouthpiece faces you. °Put your index finger on the top of the medicine canister. Your thumb supports the bottom of the inhaler and the spacer. °Breathe out (exhale) normally and as completely as possible. °Immediately after exhaling, place the spacer between your teeth and into your mouth. Close your lips tightly around the spacer. °Press the canister down with your index finger to release the medicine. °At the same time as the canister is pressed, inhale deeply and slowly until your lungs are completely filled. This should take 4-6 seconds. Keep your tongue down and out of the way. °Hold the medicine in your lungs for 5-10 seconds (10 seconds is best). This helps the medicine get into the small airways of your lungs. Exhale. °Repeat inhaling deeply through the spacer mouthpiece. Again hold that breath for up to 10 seconds (10 seconds is best). Exhale slowly. If it is difficult to take  this second deep breath through the spacer, breathe normally several times through the spacer. Remove the spacer from your mouth. °Wait at least 15-30 seconds between puffs. Continue with the above steps until you have taken the number of puffs your health care provider has ordered. Do not use the inhaler more than your health care provider tells you. °Remove the spacer from the inhaler, and place the cap on the inhaler. °Follow the directions from your health care provider   or the inhaler insert for cleaning the inhaler and spacer. °If you are using different kinds of inhalers, use your quick relief medicine to open the airways 10-15 minutes before using a steroid if instructed to do so by your health care provider. If you are unsure which inhalers to use and the order of using them, ask your health care provider, nurse, or respiratory therapist. °If you are using a steroid inhaler, always rinse your mouth with water after your last puff, then gargle and spit out the water. Do not swallow the water. °AVOID: °Inhaling before or after starting the spray of medicine. It takes practice to coordinate your breathing with triggering the spray. °Inhaling through the nose (rather than the mouth) when triggering the spray. °HOW TO DETERMINE IF YOUR INHALER IS FULL OR NEARLY EMPTY °You cannot know when an inhaler is empty by shaking it. A few inhalers are now being made with dose counters. Ask your health care provider for a prescription that has a dose counter if you feel you need that extra help. If your inhaler does not have a counter, ask your health care provider to help you determine the date you need to refill your inhaler. Write the refill date on a calendar or your inhaler canister. Refill your inhaler 7-10 days before it runs out. Be sure to keep an adequate supply of medicine. This includes making sure it is not expired, and that you have a spare inhaler.  °SEEK MEDICAL CARE IF:  °Your symptoms are only partially  relieved with your inhaler. °You are having trouble using your inhaler. °You have some increase in phlegm. °SEEK IMMEDIATE MEDICAL CARE IF:  °You feel little or no relief with your inhalers. You are still wheezing and are feeling shortness of breath or tightness in your chest or both. °You have dizziness, headaches, or a fast heart rate. °You have chills, fever, or night sweats. °You have a noticeable increase in phlegm production, or there is blood in the phlegm. °MAKE SURE YOU:  °Understand these instructions. °Will watch your condition. °Will get help right away if you are not doing well or get worse. °Document Released: 06/13/2000 Document Revised: 04/06/2013 Document Reviewed: 01/13/2013 °[employer]® Patient Information ©2015 [employer]. This information is not intended to replace advice given to you by your health care provider. Make sure you discuss any questions you have with your health care provider. ° ° ° °

## 2017-01-05 ENCOUNTER — Encounter (HOSPITAL_BASED_OUTPATIENT_CLINIC_OR_DEPARTMENT_OTHER): Payer: Self-pay | Admitting: *Deleted

## 2017-01-05 ENCOUNTER — Emergency Department (HOSPITAL_BASED_OUTPATIENT_CLINIC_OR_DEPARTMENT_OTHER)
Admission: EM | Admit: 2017-01-05 | Discharge: 2017-01-05 | Disposition: A | Discharge status: Home / Self Care | Payer: Medicaid Other | Attending: Emergency Medicine | Admitting: Emergency Medicine

## 2017-01-05 DIAGNOSIS — K219 Gastro-esophageal reflux disease without esophagitis: Secondary | ICD-10-CM | POA: Insufficient documentation

## 2017-01-05 DIAGNOSIS — J45909 Unspecified asthma, uncomplicated: Secondary | ICD-10-CM | POA: Insufficient documentation

## 2017-01-05 DIAGNOSIS — Z87891 Personal history of nicotine dependence: Secondary | ICD-10-CM | POA: Insufficient documentation

## 2017-01-05 LAB — CBC WITH DIFFERENTIAL/PLATELET
BASOS ABS: 0 10*3/uL (ref 0.0–0.1)
Basophils Relative: 1 %
EOS PCT: 6 %
Eosinophils Absolute: 0.4 10*3/uL (ref 0.0–0.7)
HCT: 38 % (ref 36.0–46.0)
HEMOGLOBIN: 13.7 g/dL (ref 12.0–15.0)
LYMPHS ABS: 2.6 10*3/uL (ref 0.7–4.0)
LYMPHS PCT: 42 %
MCH: 30.5 pg (ref 26.0–34.0)
MCHC: 36.1 g/dL — ABNORMAL HIGH (ref 30.0–36.0)
MCV: 84.6 fL (ref 78.0–100.0)
Monocytes Absolute: 0.5 10*3/uL (ref 0.1–1.0)
Monocytes Relative: 8 %
NEUTROS ABS: 2.7 10*3/uL (ref 1.7–7.7)
Neutrophils Relative %: 43 %
PLATELETS: 220 10*3/uL (ref 150–400)
RBC: 4.49 MIL/uL (ref 3.87–5.11)
RDW: 11.8 % (ref 11.5–15.5)
WBC: 6.1 10*3/uL (ref 4.0–10.5)

## 2017-01-05 LAB — COMPREHENSIVE METABOLIC PANEL
ALBUMIN: 3.7 g/dL (ref 3.5–5.0)
ALK PHOS: 28 U/L — AB (ref 38–126)
ALT: 11 U/L — ABNORMAL LOW (ref 14–54)
ANION GAP: 7 (ref 5–15)
AST: 15 U/L (ref 15–41)
BUN: 12 mg/dL (ref 6–20)
CALCIUM: 8.8 mg/dL — AB (ref 8.9–10.3)
CO2: 23 mmol/L (ref 22–32)
Chloride: 106 mmol/L (ref 101–111)
Creatinine, Ser: 0.62 mg/dL (ref 0.44–1.00)
GFR calc Af Amer: >60 mL/min (ref >60)
GFR calc non Af Amer: >60 mL/min (ref >60)
GLUCOSE: 97 mg/dL (ref 65–99)
POTASSIUM: 3.8 mmol/L (ref 3.5–5.1)
SODIUM: 136 mmol/L (ref 135–145)
Total Bilirubin: 0.5 mg/dL (ref 0.3–1.2)
Total Protein: 6.9 g/dL (ref 6.5–8.1)

## 2017-01-05 LAB — LIPASE, BLOOD: Lipase: 19 U/L (ref 11–51)

## 2017-01-05 LAB — PREGNANCY, URINE: Preg Test, Ur: NEGATIVE

## 2017-01-05 MED ORDER — SUCRALFATE 1 G PO TABS: 1.0000 g | ORAL_TABLET | Freq: Three times a day (TID) | ORAL | 0 refills | Status: DC

## 2017-01-05 MED ORDER — FAMOTIDINE IN NACL 20-0.9 MG/50ML-% IV SOLN
20.0000 mg | Freq: Once | INTRAVENOUS | Status: AC
  Administered 2017-01-05: 20 mg via INTRAVENOUS
  Filled 2017-01-05: qty 50

## 2017-01-05 MED ORDER — METOCLOPRAMIDE HCL 5 MG/ML IJ SOLN
10.0000 mg | Freq: Once | INTRAMUSCULAR | Status: AC
  Administered 2017-01-05: 10 mg via INTRAVENOUS
  Filled 2017-01-05: qty 2

## 2017-01-05 MED ORDER — FAMOTIDINE 20 MG PO TABS: 20.0000 mg | ORAL_TABLET | Freq: Two times a day (BID) | ORAL | 0 refills | Status: DC

## 2017-01-05 MED ORDER — GI COCKTAIL ~~LOC~~
30.0000 mL | Freq: Once | ORAL | Status: AC
  Administered 2017-01-05: 30 mL via ORAL
  Filled 2017-01-05: qty 30

## 2017-01-05 MED ORDER — OMEPRAZOLE 20 MG PO CPDR: 20.0000 mg | DELAYED_RELEASE_CAPSULE | Freq: Every day | ORAL | 0 refills | Status: DC

## 2017-01-05 MED ORDER — PANTOPRAZOLE SODIUM 40 MG IV SOLR
40.0000 mg | Freq: Once | INTRAVENOUS | Status: AC
  Administered 2017-01-05: 40 mg via INTRAVENOUS
  Filled 2017-01-05: qty 40

## 2017-01-05 NOTE — Discharge Instructions (Signed)
Your abdominal pain is likely from gastritis, reflux or a stomach ulcer. You will need to take the prescribed proton pump inhibitor as directed, and avoid spicy/fatty/acidic foods. Avoid laying down flat within 30 minutes of eating. Avoid NSAIDs like ibuprofen or Aleve on an empty stomach. Follow up with the gastroenterologist (GI doctor) listed for ongoing evaluation of your abdominal pain. Return to the ER for new or worsening symptoms, any additional concers.  Take the Pepcid, Prilosec, and Carafate as prescribed to help with symptoms. Follow up with your primary care doctor. May need to have a gastrointestinal referral if symptoms do not improve.    SEEK IMMEDIATE MEDICAL ATTENTION IF YOU DEVELOP ANY OF THE FOLLOWING SYMPTOMS: The pain does not go away or becomes severe.  A temperature above 101 develops.  Repeated vomiting occurs (multiple episodes).  Blood is being passed in stools or vomit (bright red or black tarry stools).  Return also if you develop chest pain, difficulty breathing, dizziness or fainting

## 2017-01-05 NOTE — ED Provider Notes (Signed)
[facility]   CSN: [id number] Arrival date & time: 01/05/17  1331     History   Chief Complaint Chief Complaint  Patient presents with  . Heartburn    HPI Elaine Miller is a 21 y.o. female.  HPI 21 year old African-American female with a past medical history significant for obesity, asthma, heartburn presents to the emergency department today with "reflux". Patient states that her symptoms have been present for one week. States she does have history of acid reflux which she thinks this is very similar. She reports a acidic taste in her mouth and burning sensation in her throat along with epigastric pain. Nothing makes her symptoms better or worse. She has tried several over-the-counter medications including Tums, Pepto-Bismol with little relief. States she has a history of this and this feels very similar to her acid reflux however is not going away. She reports epigastric abdominal pain. Worse with movement and palpation. Not associated with food. Denies any chronic NSAID use. Reports associated nausea but denies any emesis.  Pt denies any fever, chill, ha, vision changes, lightheadedness, dizziness, congestion, neck pain, cp, sob, cough, v/d, urinary symptoms, change in bowel habits, melena, hematochezia, lower extremity paresthesias.  Past Medical History:  Diagnosis Date  . Asthma   . Obesity   . Seasonal allergies     Patient Active Problem List   Diagnosis Date Noted  . Reflux 06/21/2013  . Gastritis 06/21/2013    History reviewed. No pertinent surgical history.  OB History    No data available       Home Medications    Prior to Admission medications   Medication Sig Start Date End Date Taking? Authorizing Provider  albuterol (PROVENTIL HFA;VENTOLIN HFA) 108 (90 Base) MCG/ACT inhaler Inhale 1-2 puffs into the lungs every 6 (six) hours as needed for wheezing or shortness of breath. 07/03/16   Long, Arlyss Repress, MD  benzonatate (TESSALON) 100 MG  capsule Take 1 capsule (100 mg total) by mouth 3 (three) times daily as needed for cough. 07/01/16   Fayrene Helper, PA-C  famotidine (PEPCID) 20 MG tablet Take 1 tablet (20 mg total) by mouth 2 (two) times daily. 01/05/17   Rise Mu, PA-C  omeprazole (PRILOSEC) 20 MG capsule Take 1 capsule (20 mg total) by mouth daily. 01/05/17   Rise Mu, PA-C  sucralfate (CARAFATE) 1 g tablet Take 1 tablet (1 g total) by mouth 4 (four) times daily -  with meals and at bedtime. 01/05/17   Rise Mu, PA-C    Family History History reviewed. No pertinent family history.  Social History Social History  Substance Use Topics  . Smoking status: Former Smoker    Years: 5.00  . Smokeless tobacco: Never Used  . Alcohol use No     Allergies   Augmentin [amoxicillin-pot clavulanate]   Review of Systems Review of Systems  Constitutional: Negative for chills and fever.  HENT: Negative for congestion.   Eyes: Negative for visual disturbance.  Respiratory: Negative for cough and shortness of breath.   Cardiovascular: Negative for chest pain.  Gastrointestinal: Positive for abdominal pain and nausea. Negative for diarrhea and vomiting.  Genitourinary: Negative for dysuria, flank pain, frequency, hematuria, urgency, vaginal bleeding and vaginal discharge.  Musculoskeletal: Negative for arthralgias and myalgias.  Skin: Negative for rash.  Neurological: Negative for dizziness, syncope, weakness, light-headedness, numbness and headaches.  Psychiatric/Behavioral: Negative for sleep disturbance. The patient is not nervous/anxious.      Physical Exam Updated Vital  Signs BP (!) 138/91 (BP Location: Left Arm)   Pulse 69   Temp 98.5 F (36.9 C)   Resp 16   Ht 5\' 5"  (1.651 m)   Wt (!) 137.2 kg (302 lb 6.4 oz)   LMP 12/25/2016   SpO2 100%   BMI 50.32 kg/m   Physical Exam  Constitutional: She is oriented to person, place, and time. She appears well-developed and well-nourished.   Non-toxic appearance. No distress.  HENT:  Head: Normocephalic and atraumatic.  Nose: Nose normal.  Mouth/Throat: Oropharynx is clear and moist.  Eyes: Conjunctivae are normal. Pupils are equal, round, and reactive to light. Right eye exhibits no discharge. Left eye exhibits no discharge.  Neck: Normal range of motion. Neck supple.  Cardiovascular: Normal rate, regular rhythm, normal heart sounds and intact distal pulses.  Exam reveals no gallop and no friction rub.   No murmur heard. Pulmonary/Chest: Effort normal and breath sounds normal. No respiratory distress. She has no wheezes. She has no rales. She exhibits no tenderness.  Abdominal: Soft. Bowel sounds are normal. There is tenderness in the epigastric area. There is no rigidity, no rebound, no guarding, no CVA tenderness, no tenderness at McBurney's point and negative Murphy's sign.  Musculoskeletal: Normal range of motion. She exhibits no tenderness.  Lymphadenopathy:    She has no cervical adenopathy.  Neurological: She is alert and oriented to person, place, and time.  Skin: Skin is warm and dry. Capillary refill takes less than 2 seconds.  Psychiatric: Her behavior is normal. Judgment and thought content normal.  Nursing note and vitals reviewed.    ED Treatments / Results  Labs (all labs ordered are listed, but only abnormal results are displayed) Labs Reviewed  CBC WITH DIFFERENTIAL/PLATELET - Abnormal; Notable for the following:       Result Value   MCHC 36.1 (*)    All other components within normal limits  COMPREHENSIVE METABOLIC PANEL - Abnormal; Notable for the following:    Calcium 8.8 (*)    ALT 11 (*)    Alkaline Phosphatase 28 (*)    All other components within normal limits  PREGNANCY, URINE  LIPASE, BLOOD    EKG  EKG Interpretation None       Radiology No results found.  Procedures Procedures (including critical care time)  Medications Ordered in ED Medications  gi cocktail  (Maalox,Lidocaine,Donnatal) (30 mLs Oral Given 01/05/17 1506)  famotidine (PEPCID) IVPB 20 mg premix (0 mg Intravenous Stopped 01/05/17 1539)  metoCLOPramide (REGLAN) injection 10 mg (10 mg Intravenous Given 01/05/17 1507)  pantoprazole (PROTONIX) injection 40 mg (40 mg Intravenous Given 01/05/17 1508)     Initial Impression / Assessment and Plan / ED Course  I have reviewed the triage vital signs and the nursing notes.  Pertinent labs & imaging results that were available during my care of the patient were reviewed by me and considered in my medical decision making (see chart for details).     Patient presents to the emergency Department today with complaints of "reflux". History of GERD. Has tried several over-the-counter medications with little relief. She denies any associated symptoms including chest pain, shortness of breath. Does report some nausea but denies emesis. Reports mild epigastric abdominal pain. On exam patient has no signs of peritonitis. She is overall well-appearing and nontoxic. Vital signs are normal.  Lab work is unremarkable. No leukocytosis. Lipase is normal. Low suspicion for pancreatitis. She has no right upper quadrant abdominal pain that would be consistent with cholecystitis.  Patient's symptoms seemed consistent with GERD and possible gastritis. Patient provided medications in the ED with significant improvement in her symptoms. Able to tolerate by mouth fluids without any difficulties. Patient presentation is not consistent with ACS or PE.  Encourage symptomatic treatment at home and have prescribed Carafate, Prilosec, Pepcid. Encouraged follow-up with her primary care doctor and further referral to gastroenterology if symptoms persist.  Pt is hemodynamically stable, in NAD, & able to ambulate in the ED. Evaluation does not show pathology that would require ongoing emergent intervention or inpatient treatment. I explained the diagnosis to the patient. Pain has been managed  & has no complaints prior to dc. Pt is comfortable with above plan and is stable for discharge at this time. All questions were answered prior to disposition. Strict return precautions for f/u to the ED were discussed. Encouraged follow up with PCP.   Final Clinical Impressions(s) / ED Diagnoses   Final diagnoses:  Gastroesophageal reflux disease, esophagitis presence not specified    New Prescriptions Discharge Medication List as of 01/05/2017  4:18 PM    START taking these medications   Details  famotidine (PEPCID) 20 MG tablet Take 1 tablet (20 mg total) by mouth 2 (two) times daily., Starting Mon 01/05/2017, Print    omeprazole (PRILOSEC) 20 MG capsule Take 1 capsule (20 mg total) by mouth daily., Starting Mon 01/05/2017, Print    sucralfate (CARAFATE) 1 g tablet Take 1 tablet (1 g total) by mouth 4 (four) times daily -  with meals and at bedtime., Starting Mon 01/05/2017, Print         Rise Mu, PA-C 01/05/17 1704    Charlynne Pander, MD 01/05/17 [id number]

## 2017-01-05 NOTE — ED Triage Notes (Signed)
Pt c/o " reflux" x 1 week , described as burning in throat

## 2017-01-06 ENCOUNTER — Emergency Department (HOSPITAL_BASED_OUTPATIENT_CLINIC_OR_DEPARTMENT_OTHER)
Admission: EM | Admit: 2017-01-06 | Discharge: 2017-01-06 | Disposition: A | Discharge status: Home / Self Care | Payer: Self-pay | Attending: Emergency Medicine | Admitting: Emergency Medicine

## 2017-01-06 ENCOUNTER — Encounter (HOSPITAL_BASED_OUTPATIENT_CLINIC_OR_DEPARTMENT_OTHER): Payer: Self-pay | Admitting: Emergency Medicine

## 2017-01-06 DIAGNOSIS — J45909 Unspecified asthma, uncomplicated: Secondary | ICD-10-CM | POA: Insufficient documentation

## 2017-01-06 DIAGNOSIS — J020 Streptococcal pharyngitis: Secondary | ICD-10-CM | POA: Insufficient documentation

## 2017-01-06 LAB — RAPID STREP SCREEN (MED CTR MEBANE ONLY): STREPTOCOCCUS, GROUP A SCREEN (DIRECT): POSITIVE — AB

## 2017-01-06 MED ORDER — CEPHALEXIN 500 MG PO CAPS: 500.0000 mg | ORAL_CAPSULE | Freq: Three times a day (TID) | ORAL | 0 refills | Status: DC

## 2017-01-06 MED ORDER — LIDOCAINE VISCOUS 2 % MT SOLN
20.0000 mL | OROMUCOSAL | 0 refills | Status: DC | PRN
Start: 2017-01-06 — End: 2021-11-12

## 2017-01-06 MED ORDER — IBUPROFEN 400 MG PO TABS
600.0000 mg | ORAL_TABLET | Freq: Once | ORAL | Status: AC
  Administered 2017-01-06: 600 mg via ORAL
  Filled 2017-01-06: qty 1

## 2017-01-06 MED ORDER — DEXAMETHASONE SODIUM PHOSPHATE 10 MG/ML IJ SOLN
10.0000 mg | Freq: Once | INTRAMUSCULAR | Status: AC
  Administered 2017-01-06: 10 mg via INTRAMUSCULAR
  Filled 2017-01-06: qty 1

## 2017-01-06 MED ORDER — DEXAMETHASONE SODIUM PHOSPHATE 10 MG/ML IJ SOLN: 10.0000 mg | Freq: Once | INTRAMUSCULAR | Status: DC

## 2017-01-06 NOTE — Discharge Instructions (Signed)
You have been diagnosed with strep throat. Use the keflex  for 10 full days. It is very important that  you complete the entire course of this medication or the strep may not completely be treated.  Also discard  your toothbrush and begin using a new one in 3 days. For sore throat, take ibuprofen or tylenol every 6hr as needed. Follow up with your doctor in 2-3 days if no improvement. Return to the ED sooner for worsening condition, inability to swallow, breathing difficulty, new concerns.

## 2017-01-06 NOTE — ED Provider Notes (Signed)
[facility]   CSN: [id number] Arrival date & time: 01/06/17  1034     History   Chief Complaint Chief Complaint  Patient presents with  . Sore Throat    HPI Elaine Miller is a 21 y.o. female.  HPI 21 year old American female with no significant past medical history presents to the emergency Department today with complaints of sore throat. Patient states that her sore throat started last night and has progressively worsened. Painful to swallow. Reports cold chills last night but denies any fever. Able to swallow by mouth fluid and food. Has not tried any further symptoms prior to arrival. Swallowing makes the pain worse. Nothing makes the pain better. She denies any fever, ear pain, cough. Past Medical History:  Diagnosis Date  . Asthma   . Obesity   . Seasonal allergies     Patient Active Problem List   Diagnosis Date Noted  . Reflux 06/21/2013  . Gastritis 06/21/2013    No past surgical history on file.  OB History    No data available       Home Medications    Prior to Admission medications   Medication Sig Start Date End Date Taking? Authorizing Provider  albuterol (PROVENTIL HFA;VENTOLIN HFA) 108 (90 Base) MCG/ACT inhaler Inhale 1-2 puffs into the lungs every 6 (six) hours as needed for wheezing or shortness of breath. 07/03/16   Long, Arlyss RepressJoshua G, MD  benzonatate (TESSALON) 100 MG capsule Take 1 capsule (100 mg total) by mouth 3 (three) times daily as needed for cough. 07/01/16   Fayrene Helperran, Bowie, PA-C  cephALEXin (KEFLEX) 500 MG capsule Take 1 capsule (500 mg total) by mouth 3 (three) times daily. 01/06/17   Rise MuLeaphart, Kenneth T, PA-C  famotidine (PEPCID) 20 MG tablet Take 1 tablet (20 mg total) by mouth 2 (two) times daily. 01/05/17   Demetrios LollLeaphart, Kenneth T, PA-C  lidocaine (XYLOCAINE) 2 % solution Use as directed 20 mLs in the mouth or throat as needed for mouth pain. 01/06/17   Rise MuLeaphart, Kenneth T, PA-C  omeprazole (PRILOSEC) 20 MG capsule Take 1  capsule (20 mg total) by mouth daily. 01/05/17   Rise MuLeaphart, Kenneth T, PA-C  sucralfate (CARAFATE) 1 g tablet Take 1 tablet (1 g total) by mouth 4 (four) times daily -  with meals and at bedtime. 01/05/17   Rise MuLeaphart, Kenneth T, PA-C    Family History No family history on file.  Social History Social History  Substance Use Topics  . Smoking status: Former Smoker    Years: 5.00  . Smokeless tobacco: Never Used  . Alcohol use No     Allergies   Augmentin [amoxicillin-pot clavulanate]   Review of Systems Review of Systems  Constitutional: Positive for chills. Negative for fever.  HENT: Positive for sore throat and trouble swallowing. Negative for ear pain.   Respiratory: Negative for cough.   Gastrointestinal: Negative for nausea and vomiting.     Physical Exam Updated Vital Signs BP 137/88 (BP Location: Right Arm)   Pulse 98   Temp 98.8 F (37.1 C) (Oral)   Resp 18   Ht 5\' 5"  (1.651 m)   Wt (!) 137 kg (302 lb)   LMP 12/25/2016   SpO2 100%   BMI 50.26 kg/m   Physical Exam  Constitutional: She appears well-developed and well-nourished. No distress.  HENT:  Head: Normocephalic and atraumatic.  Right Ear: Tympanic membrane, external ear and ear canal normal.  Left Ear: Tympanic membrane, external ear and ear canal  normal.  Nose: Nose normal.  Mouth/Throat: Uvula is midline and mucous membranes are normal. No trismus in the jaw. No uvula swelling. Oropharyngeal exudate, posterior oropharyngeal edema and posterior oropharyngeal erythema present. No tonsillar abscesses. Tonsils are 2+ on the right. Tonsils are 2+ on the left. Tonsillar exudate.  Eyes: Right eye exhibits no discharge. Left eye exhibits no discharge. No scleral icterus.  Neck: Normal range of motion. Neck supple.  Pulmonary/Chest: Effort normal and breath sounds normal. No respiratory distress. She has no wheezes. She has no rales. She exhibits no tenderness.  Musculoskeletal: Normal range of motion.    Lymphadenopathy:    She has no cervical adenopathy.  Neurological: She is alert.  Skin: No pallor.  Psychiatric: Her behavior is normal. Judgment and thought content normal.  Nursing note and vitals reviewed.    ED Treatments / Results  Labs (all labs ordered are listed, but only abnormal results are displayed) Labs Reviewed  RAPID STREP SCREEN (NOT AT [facility]) - Abnormal; Notable for the following:       Result Value   Streptococcus, Group A Screen (Direct) POSITIVE (*)    All other components within normal limits    EKG  EKG Interpretation None       Radiology No results found.  Procedures Procedures (including critical care time)  Medications Ordered in ED Medications  ibuprofen (ADVIL,MOTRIN) tablet 600 mg (not administered)  dexamethasone (DECADRON) injection 10 mg (not administered)     Initial Impression / Assessment and Plan / ED Course  I have reviewed the triage vital signs and the nursing notes.  Pertinent labs & imaging results that were available during my care of the patient were reviewed by me and considered in my medical decision making (see chart for details).     Pt febrile with tonsillar exudate, cervical lymphadenopathy, & dysphagia; diagnosis of strep. Treated in the Ed with steroids, NSAIDs, Pain medication. Patient with allergy to Augmentin. Spoke with pharmacy and will prescribed Keflex.  Pt appears mildly dehydrated, discussed importance of water rehydration. Presentation non concerning for PTA or infxn spread to soft tissue. No trismus or uvula deviation. Specific return precautions discussed. Pt able to drink water in ED without difficulty with intact air way. Recommended PCP follow up.    Final Clinical Impressions(s) / ED Diagnoses   Final diagnoses:  Strep throat    New Prescriptions New Prescriptions   CEPHALEXIN (KEFLEX) 500 MG CAPSULE    Take 1 capsule (500 mg total) by mouth 3 (three) times daily.   LIDOCAINE (XYLOCAINE) 2 %  SOLUTION    Use as directed 20 mLs in the mouth or throat as needed for mouth pain.     Rise Mu, PA-C 01/06/17 1228    Tegeler, Canary Brim, MD 01/06/17 [phone]

## 2017-01-06 NOTE — ED Triage Notes (Signed)
Pt having sore throat since yesterday.  Pt having painful swallowing.  Some cold chills last night.

## 2017-01-09 ENCOUNTER — Emergency Department (HOSPITAL_BASED_OUTPATIENT_CLINIC_OR_DEPARTMENT_OTHER)
Admission: EM | Admit: 2017-01-09 | Discharge: 2017-01-09 | Disposition: A | Discharge status: Home / Self Care | Payer: Self-pay | Attending: Emergency Medicine | Admitting: Emergency Medicine

## 2017-01-09 ENCOUNTER — Encounter (HOSPITAL_BASED_OUTPATIENT_CLINIC_OR_DEPARTMENT_OTHER): Payer: Self-pay | Admitting: *Deleted

## 2017-01-09 DIAGNOSIS — J029 Acute pharyngitis, unspecified: Secondary | ICD-10-CM | POA: Insufficient documentation

## 2017-01-09 DIAGNOSIS — Z79899 Other long term (current) drug therapy: Secondary | ICD-10-CM | POA: Insufficient documentation

## 2017-01-09 DIAGNOSIS — Z87891 Personal history of nicotine dependence: Secondary | ICD-10-CM | POA: Insufficient documentation

## 2017-01-09 DIAGNOSIS — J45909 Unspecified asthma, uncomplicated: Secondary | ICD-10-CM | POA: Insufficient documentation

## 2017-01-09 MED ORDER — DEXAMETHASONE SODIUM PHOSPHATE 10 MG/ML IJ SOLN
10.0000 mg | Freq: Once | INTRAMUSCULAR | Status: AC
  Administered 2017-01-09: 10 mg via INTRAMUSCULAR
  Filled 2017-01-09: qty 1

## 2017-01-09 NOTE — ED Triage Notes (Signed)
She was diagnosed with strep 3 days ago. Her for sore throat.

## 2017-01-09 NOTE — ED Provider Notes (Signed)
[facility]   CSN: [id number] Arrival date & time: 01/09/17  1312     History   Chief Complaint Chief Complaint  Patient presents with  . Sore Throat    HPI Elaine Miller is a 21 y.o. female who presents today with chief complaint acute onset, gradually worsening sore throat. She was seen and evaluated here 3 days ago and was diagnosed with strep pharyngitis. Due to Augmentin allergy, she was sent home with 10 day course of a flexed 3 times a day. She states she has been taking this as prescribed. She states that on leaving the ED 3 days ago, she only had mild pain. She awoke this morning with acute worsening of her sore throat, with pain radiating up to the ears. Swallowing makes pain worse. She denies fevers, drooling, difficulty eating or drinking, shortness of breath, nausea, or vomiting. Has not tried anything for her symptoms. The history is provided by the patient.    Past Medical History:  Diagnosis Date  . Asthma   . Obesity   . Seasonal allergies     Patient Active Problem List   Diagnosis Date Noted  . Reflux 06/21/2013  . Gastritis 06/21/2013    History reviewed. No pertinent surgical history.  OB History    No data available       Home Medications    Prior to Admission medications   Medication Sig Start Date End Date Taking? Authorizing Provider  albuterol (PROVENTIL HFA;VENTOLIN HFA) 108 (90 Base) MCG/ACT inhaler Inhale 1-2 puffs into the lungs every 6 (six) hours as needed for wheezing or shortness of breath. 07/03/16   Long, Arlyss RepressJoshua G, MD  benzonatate (TESSALON) 100 MG capsule Take 1 capsule (100 mg total) by mouth 3 (three) times daily as needed for cough. 07/01/16   Fayrene Helperran, Bowie, PA-C  cephALEXin (KEFLEX) 500 MG capsule Take 1 capsule (500 mg total) by mouth 3 (three) times daily. 01/06/17   Rise MuLeaphart, Kenneth T, PA-C  famotidine (PEPCID) 20 MG tablet Take 1 tablet (20 mg total) by mouth 2 (two) times daily. 01/05/17   Demetrios LollLeaphart, Kenneth  T, PA-C  lidocaine (XYLOCAINE) 2 % solution Use as directed 20 mLs in the mouth or throat as needed for mouth pain. 01/06/17   Rise MuLeaphart, Kenneth T, PA-C  omeprazole (PRILOSEC) 20 MG capsule Take 1 capsule (20 mg total) by mouth daily. 01/05/17   Rise MuLeaphart, Kenneth T, PA-C  sucralfate (CARAFATE) 1 g tablet Take 1 tablet (1 g total) by mouth 4 (four) times daily -  with meals and at bedtime. 01/05/17   Rise MuLeaphart, Kenneth T, PA-C    Family History No family history on file.  Social History Social History  Substance Use Topics  . Smoking status: Former Smoker    Years: 5.00  . Smokeless tobacco: Never Used  . Alcohol use No     Allergies   Augmentin [amoxicillin-pot clavulanate]   Review of Systems Review of Systems  Constitutional: Negative for chills and fever.  HENT: Positive for sore throat. Negative for congestion, drooling, sinus pain and sinus pressure.   Respiratory: Negative for cough and shortness of breath.   Cardiovascular: Negative for chest pain.  Gastrointestinal: Negative for nausea and vomiting.     Physical Exam Updated Vital Signs BP 134/83   Pulse 61   Temp 98.8 F (37.1 C) (Oral)   Resp 20   Ht 5\' 5"  (1.651 m)   Wt (!) 137 kg (302 lb)   LMP 12/25/2016  SpO2 99%   BMI 50.26 kg/m   Physical Exam  Constitutional: She appears well-developed and well-nourished. No distress.  HENT:  Head: Normocephalic and atraumatic.  Right Ear: External ear normal.  Left Ear: External ear normal.  TMs normal bilaterally, no frontal or maxillary sinus TTP. Posterior oropharynx with tonsillar hypertrophy and erythema, no exudates or uvular deviation noted. No trismus or sublingual abnormalities. No drooling.  Eyes: Conjunctivae are normal. Right eye exhibits no discharge. Left eye exhibits no discharge.  Neck: Normal range of motion. Neck supple. No JVD present. No tracheal deviation present.  Bilateral anterior cervical LAD  Cardiovascular: Normal rate.     Pulmonary/Chest: Effort normal and breath sounds normal. No respiratory distress. She has no wheezes. She has no rales.  Abdominal: She exhibits no distension.  Musculoskeletal: She exhibits no edema.  Lymphadenopathy:    She has cervical adenopathy.  Neurological: She is alert.  Skin: No erythema.  Psychiatric: She has a normal mood and affect. Her behavior is normal.  Nursing note and vitals reviewed.    ED Treatments / Results  Labs (all labs ordered are listed, but only abnormal results are displayed) Labs Reviewed - No data to display  EKG  EKG Interpretation None       Radiology No results found.  Procedures Procedures (including critical care time)  Medications Ordered in ED Medications  dexamethasone (DECADRON) injection 10 mg (not administered)     Initial Impression / Assessment and Plan / ED Course  I have reviewed the triage vital signs and the nursing notes.  Pertinent labs & imaging results that were available during my care of the patient were reviewed by me and considered in my medical decision making (see chart for details).     Patient with diagnosis of strep throat, on day 3 of Keflex. Afebrile, vital signs are stable here. Airway is patent, and patient is able to tolerate PO fluids. Presentation non concerning for PTA or infxn spread to soft tissue. No trismus or uvula deviation. Will treat swelling and inflammation with IM Decadron. Discussed importance of completing antibiotics and symptomatic treatment. Recurrent follow-up with primary care physician for reevaluation in the next 3-4 days. Discussed strict indications for return to the ED. Pt verbalized understanding of and agreement with plan and is safe for discharge home at this time.   Final Clinical Impressions(s) / ED Diagnoses   Final diagnoses:  Sore throat    New Prescriptions New Prescriptions   No medications on file     Bennye Alm 01/09/17 1348    Vanetta Mulders, MD 01/10/17 1110

## 2017-01-09 NOTE — Discharge Instructions (Signed)
Take ibuprofen or Tylenol as needed for pain. Use warm water salt gargles, warm teas, throat lozenges or over-the-counter sprays for comfort. Continue taking your antibiotic as prescribed to completion. You may take probiotics or yogurt with this to avoid side effects such as diarrhea. Follow-up with your primary care physician for reevaluation if symptoms persist in the next 48 hours or so. Return to the ED immediately if you experience concerning signs or symptoms such as difficulty swallowing, difficulty breathing, drooling, or are unable to keep food or drink down.

## 2017-06-17 ENCOUNTER — Other Ambulatory Visit: Payer: Self-pay

## 2017-06-17 ENCOUNTER — Emergency Department (HOSPITAL_BASED_OUTPATIENT_CLINIC_OR_DEPARTMENT_OTHER)
Admission: EM | Admit: 2017-06-17 | Discharge: 2017-06-17 | Disposition: A | Discharge status: Home / Self Care | Payer: Self-pay | Attending: Emergency Medicine | Admitting: Emergency Medicine

## 2017-06-17 ENCOUNTER — Encounter (HOSPITAL_BASED_OUTPATIENT_CLINIC_OR_DEPARTMENT_OTHER): Payer: Self-pay | Admitting: *Deleted

## 2017-06-17 DIAGNOSIS — Z79899 Other long term (current) drug therapy: Secondary | ICD-10-CM | POA: Insufficient documentation

## 2017-06-17 DIAGNOSIS — Z87891 Personal history of nicotine dependence: Secondary | ICD-10-CM | POA: Insufficient documentation

## 2017-06-17 DIAGNOSIS — R51 Headache: Secondary | ICD-10-CM | POA: Insufficient documentation

## 2017-06-17 DIAGNOSIS — R519 Headache, unspecified: Secondary | ICD-10-CM

## 2017-06-17 DIAGNOSIS — J45909 Unspecified asthma, uncomplicated: Secondary | ICD-10-CM | POA: Insufficient documentation

## 2017-06-17 MED ORDER — PROCHLORPERAZINE EDISYLATE 5 MG/ML IJ SOLN
10.0000 mg | Freq: Once | INTRAMUSCULAR | Status: AC
  Administered 2017-06-17: 10 mg via INTRAVENOUS
  Filled 2017-06-17: qty 2

## 2017-06-17 MED ORDER — DEXAMETHASONE SODIUM PHOSPHATE 10 MG/ML IJ SOLN
10.0000 mg | Freq: Once | INTRAMUSCULAR | Status: AC
  Administered 2017-06-17: 10 mg via INTRAVENOUS
  Filled 2017-06-17: qty 1

## 2017-06-17 MED ORDER — KETOROLAC TROMETHAMINE 30 MG/ML IJ SOLN
30.0000 mg | Freq: Once | INTRAMUSCULAR | Status: AC
  Administered 2017-06-17: 30 mg via INTRAVENOUS
  Filled 2017-06-17: qty 1

## 2017-06-17 MED ORDER — DIPHENHYDRAMINE HCL 50 MG/ML IJ SOLN
12.5000 mg | Freq: Once | INTRAMUSCULAR | Status: AC
  Administered 2017-06-17: 12.5 mg via INTRAVENOUS
  Filled 2017-06-17: qty 1

## 2017-06-17 MED ORDER — SODIUM CHLORIDE 0.9 % IV BOLUS (SEPSIS)
500.0000 mL | Freq: Once | INTRAVENOUS | Status: AC
  Administered 2017-06-17: 500 mL via INTRAVENOUS

## 2017-06-17 NOTE — Discharge Instructions (Signed)
It was my pleasure taking care of you today!  Drink plenty of fluids at home. This will help with your headache.  Please follow up with a primary care doctor. Please see attached handout for help with finding a primary care doctor in the area.  Fortunately, your evaluation today is reassuring with no apparent emergent cause for your headache at this time. With that being said, it is VERY important that you monitor your symptoms at home. If you develop worsening headache, new fever, new neck stiffness, rash, weakness, numbness, trouble with your speech, trouble walking, new or worsening symptoms or any concerning symptoms, please return to the ED immediately.

## 2017-06-17 NOTE — ED Provider Notes (Signed)
[facility] EMERGENCY DEPARTMENT Provider Note   CSN: [id number] Arrival date & time: 06/17/17  1351     History   Chief Complaint Chief Complaint  Patient presents with  . Headache    HPI Elaine Miller is a 21 y.o. female.  The history is provided by the patient and medical records. No language interpreter was used.  Headache   Associated symptoms include nausea. Pertinent negatives include no fever and no vomiting.   Elaine Miller is a 21 y.o. female who presents to the Emergency Department complaining of throbbing headache. Patient states that she has been getting headache daily for about 2-3 weeks. Initially bilateral and frontal which worsened throughout the day at work while she was working on her computer. She would take ibuprofen and headache would improve. Over the last week, headache has now become unilateral on the right with throbbing sensation around right ear. Associated with nausea (no vomiting) and mild photophobia. She does endorse history of headaches, but notes they will typically last a couple days and improve with ibuprofen. This is longer than most of her headaches typically last. No fever, chills, neck pain, numbness, tingling, weakness, gait abnormalities, slurred speech, visual changes.   Past Medical History:  Diagnosis Date  . Asthma   . Obesity   . Seasonal allergies     Patient Active Problem List   Diagnosis Date Noted  . Reflux 06/21/2013  . Gastritis 06/21/2013    History reviewed. No pertinent surgical history.  OB History    No data available       Home Medications    Prior to Admission medications   Medication Sig Start Date End Date Taking? Authorizing Provider  albuterol (PROVENTIL HFA;VENTOLIN HFA) 108 (90 Base) MCG/ACT inhaler Inhale 1-2 puffs into the lungs every 6 (six) hours as needed for wheezing or shortness of breath. 07/03/16   Long, Arlyss Repress, MD  benzonatate (TESSALON) 100 MG capsule Take 1 capsule (100 mg total)  by mouth 3 (three) times daily as needed for cough. 07/01/16   Fayrene Helper, PA-C  famotidine (PEPCID) 20 MG tablet Take 1 tablet (20 mg total) by mouth 2 (two) times daily. 01/05/17   Demetrios Loll T, PA-C  lidocaine (XYLOCAINE) 2 % solution Use as directed 20 mLs in the mouth or throat as needed for mouth pain. 01/06/17   Rise Mu, PA-C  omeprazole (PRILOSEC) 20 MG capsule Take 1 capsule (20 mg total) by mouth daily. 01/05/17   Rise Mu, PA-C  sucralfate (CARAFATE) 1 g tablet Take 1 tablet (1 g total) by mouth 4 (four) times daily -  with meals and at bedtime. 01/05/17   Rise Mu, PA-C    Family History History reviewed. No pertinent family history.  Social History Social History   Tobacco Use  . Smoking status: Former Smoker    Years: 5.00  . Smokeless tobacco: Never Used  Substance Use Topics  . Alcohol use: No  . Drug use: No     Allergies   Augmentin [amoxicillin-pot clavulanate]   Review of Systems Review of Systems  Constitutional: Negative for fever.  Eyes: Positive for photophobia. Negative for pain, discharge, redness, itching and visual disturbance.  Gastrointestinal: Positive for nausea. Negative for abdominal pain, diarrhea and vomiting.  Neurological: Positive for headaches. Negative for dizziness, syncope, weakness and numbness.  All other systems reviewed and are negative.   Physical Exam Updated Vital Signs BP (!) 151/97   Pulse 86   Temp 98.8  F (37.1 C)   Resp 16   Ht 5\' 5"  (1.651 m)   Wt (!) 149.3 kg (329 lb 3.2 oz)   LMP 06/05/2017   SpO2 99%   BMI 54.78 kg/m   Physical Exam  Constitutional: She is oriented to person, place, and time. She appears well-developed and well-nourished. No distress.  HENT:  Head: Normocephalic and atraumatic.  Mouth/Throat: Oropharynx is clear and moist.  No tenderness of the temporal artery   Eyes: Conjunctivae and EOM are normal. Pupils are equal, round, and reactive to light. No  scleral icterus.  No nystagmus   Neck: Normal range of motion. Neck supple.  Full active and passive ROM without pain.  No midline or paraspinal tenderness. No nuchal rigidity or meningeal signs.  Cardiovascular: Normal rate, regular rhythm, normal heart sounds and intact distal pulses.  Pulmonary/Chest: Effort normal and breath sounds normal. No respiratory distress. She has no wheezes. She has no rales.  Abdominal: Soft. Bowel sounds are normal. She exhibits no distension. There is no tenderness. There is no rebound and no guarding.  Musculoskeletal: Normal range of motion.  Lymphadenopathy:    She has no cervical adenopathy.  Neurological: She is alert and oriented to person, place, and time. She has normal reflexes. No cranial nerve deficit. Coordination normal.  Alert, oriented, thought content appropriate, able to give a coherent history. Speech is clear and goal oriented, able to follow commands.  Cranial Nerves:  II:  Peripheral visual fields grossly normal, pupils equal, round, reactive to light III, IV, VI: EOM intact bilaterally, ptosis not present V,VII: smile symmetric, eyes kept closed tightly against resistance, facial light touch sensation equal VIII: hearing grossly normal IX, X: symmetric soft palate movement, uvula elevates symmetrically  XI: bilateral shoulder shrug symmetric and strong XII: midline tongue extension 5/5 muscle strength in upper and lower extremities bilaterally including strong and equal grip strength and dorsiflexion/plantar flexion Sensory to light touch normal in all four extremities.  Normal finger-to-nose and rapid alternating movements. No drift. Steady gait.  Skin: Skin is warm and dry. No rash noted. She is not diaphoretic.  Nursing note and vitals reviewed.    ED Treatments / Results  Labs (all labs ordered are listed, but only abnormal results are displayed) Labs Reviewed - No data to display  EKG  EKG Interpretation None        Radiology No results found.  Procedures Procedures (including critical care time)  Medications Ordered in ED Medications  ketorolac (TORADOL) 30 MG/ML injection 30 mg (30 mg Intravenous Given 06/17/17 1449)    And  prochlorperazine (COMPAZINE) injection 10 mg (10 mg Intravenous Given 06/17/17 1448)    And  diphenhydrAMINE (BENADRYL) injection 12.5 mg (12.5 mg Intravenous Given 06/17/17 1446)    And  sodium chloride 0.9 % bolus 500 mL (0 mLs Intravenous Stopped 06/17/17 1527)  dexamethasone (DECADRON) injection 10 mg (10 mg Intravenous Given 06/17/17 1448)     Initial Impression / Assessment and Plan / ED Course  I have reviewed the triage vital signs and the nursing notes.  Pertinent labs & imaging results that were available during my care of the patient were reviewed by me and considered in my medical decision making (see chart for details).    Elaine Miller is a 21 y.o. female who presents to ED for headache. No focal neuro deficits on exam. Migraine cocktail and fluids given.   On re-evaluation, patient feels much improved and headache resolved. The patient denies any neurologic symptoms  such as visual changes, focal numbness/weakness, balance problems, confusion, or speech difficulty to suggest a life-threatening intracranial process such as intracranial hemorrhage or mass. The patient has no clotting risk factors thus venous sinus thrombosis is unlikely. No fevers, neck pain or nuchal rigidity to suggest meningitis. I feel that the patient is safe for discharge home at this time. PCP follow up strongly encouraged. I have reviewed return precautions including development of neurologic symptoms, confusion, lethargy, difficulty speaking, or new/worsening/concerning symptoms. All questions answered.   Final Clinical Impressions(s) / ED Diagnoses   Final diagnoses:  Bad headache    ED Discharge Orders    None       Urban Naval, Chase PicketJaime Pilcher, PA-C 06/17/17 1538     Loren RacerYelverton, David, MD 06/17/17 [phone]

## 2017-06-17 NOTE — ED Triage Notes (Signed)
Pt c/o h/a x 3 weeks " om and off " also c/o right ear fullness

## 2017-08-05 ENCOUNTER — Emergency Department (HOSPITAL_BASED_OUTPATIENT_CLINIC_OR_DEPARTMENT_OTHER)
Admission: EM | Admit: 2017-08-05 | Discharge: 2017-08-05 | Disposition: A | Discharge status: Home / Self Care | Payer: 59 | Attending: Emergency Medicine | Admitting: Emergency Medicine

## 2017-08-05 ENCOUNTER — Other Ambulatory Visit: Payer: Self-pay

## 2017-08-05 ENCOUNTER — Encounter (HOSPITAL_BASED_OUTPATIENT_CLINIC_OR_DEPARTMENT_OTHER): Payer: Self-pay | Admitting: *Deleted

## 2017-08-05 DIAGNOSIS — B9789 Other viral agents as the cause of diseases classified elsewhere: Secondary | ICD-10-CM | POA: Diagnosis not present

## 2017-08-05 DIAGNOSIS — Z87891 Personal history of nicotine dependence: Secondary | ICD-10-CM | POA: Insufficient documentation

## 2017-08-05 DIAGNOSIS — J4521 Mild intermittent asthma with (acute) exacerbation: Secondary | ICD-10-CM | POA: Diagnosis not present

## 2017-08-05 DIAGNOSIS — J069 Acute upper respiratory infection, unspecified: Secondary | ICD-10-CM | POA: Diagnosis not present

## 2017-08-05 DIAGNOSIS — Z79899 Other long term (current) drug therapy: Secondary | ICD-10-CM | POA: Diagnosis not present

## 2017-08-05 DIAGNOSIS — R05 Cough: Secondary | ICD-10-CM | POA: Diagnosis present

## 2017-08-05 MED ORDER — ALBUTEROL SULFATE (2.5 MG/3ML) 0.083% IN NEBU
INHALATION_SOLUTION | RESPIRATORY_TRACT | Status: AC
  Administered 2017-08-05: 2.5 mg
  Filled 2017-08-05: qty 3

## 2017-08-05 MED ORDER — OSELTAMIVIR PHOSPHATE 75 MG PO CAPS: 75.0000 mg | ORAL_CAPSULE | Freq: Two times a day (BID) | ORAL | 0 refills | Status: DC

## 2017-08-05 MED ORDER — DEXAMETHASONE 6 MG PO TABS
10.0000 mg | ORAL_TABLET | Freq: Once | ORAL | Status: AC
  Administered 2017-08-05: 10 mg via ORAL
  Filled 2017-08-05: qty 1

## 2017-08-05 MED ORDER — AEROCHAMBER PLUS FLO-VU MEDIUM MISC
1.0000 | Freq: Once | Status: AC
  Administered 2017-08-05: 1
  Filled 2017-08-05: qty 1

## 2017-08-05 MED ORDER — IPRATROPIUM-ALBUTEROL 0.5-2.5 (3) MG/3ML IN SOLN
RESPIRATORY_TRACT | Status: AC
  Administered 2017-08-05: 3 mL
  Filled 2017-08-05: qty 3

## 2017-08-05 MED ORDER — ALBUTEROL SULFATE HFA 108 (90 BASE) MCG/ACT IN AERS
2.0000 | INHALATION_SPRAY | RESPIRATORY_TRACT | Status: DC | PRN
  Administered 2017-08-05: 2 via RESPIRATORY_TRACT
  Filled 2017-08-05: qty 6.7

## 2017-08-05 NOTE — ED Notes (Signed)
Family at bedside. 

## 2017-08-05 NOTE — ED Triage Notes (Signed)
Pt c/o URI symptoms x 3 days hs asthma

## 2017-08-05 NOTE — ED Notes (Signed)
Patient is resting comfortably. 

## 2017-08-05 NOTE — ED Provider Notes (Signed)
[facility] EMERGENCY DEPARTMENT Provider Note   CSN: [id number] Arrival date & time: 08/05/17  1024     History   Chief Complaint Chief Complaint  Patient presents with  . URI    HPI Elaine Miller is a 22 y.o. female.  22 year old female with history of asthma who presents with cough and wheezing.  Approximately 3 days ago, she began having a sore throat associated with cough and chest congestion.  She has had associated fatigue and generalized body aches, no fevers, vomiting, or diarrhea.  She ran out of her inhaler and feels like she is continuing to wheeze.  She reports improvement in the wheezing after receiving DuoNeb here.  No sick contacts.  No medications prior to arrival.   The history is provided by the patient.  URI      Past Medical History:  Diagnosis Date  . Asthma   . Obesity   . Seasonal allergies     Patient Active Problem List   Diagnosis Date Noted  . Reflux 06/21/2013  . Gastritis 06/21/2013    History reviewed. No pertinent surgical history.  OB History    No data available       Home Medications    Prior to Admission medications   Medication Sig Start Date End Date Taking? Authorizing Provider  albuterol (PROVENTIL HFA;VENTOLIN HFA) 108 (90 Base) MCG/ACT inhaler Inhale 1-2 puffs into the lungs every 6 (six) hours as needed for wheezing or shortness of breath. 07/03/16   Long, Arlyss RepressJoshua G, MD  benzonatate (TESSALON) 100 MG capsule Take 1 capsule (100 mg total) by mouth 3 (three) times daily as needed for cough. 07/01/16   Fayrene Helperran, Bowie, PA-C  lidocaine (XYLOCAINE) 2 % solution Use as directed 20 mLs in the mouth or throat as needed for mouth pain. 01/06/17   Rise MuLeaphart, Kenneth T, PA-C  omeprazole (PRILOSEC) 20 MG capsule Take 1 capsule (20 mg total) by mouth daily. 01/05/17   Rise MuLeaphart, Kenneth T, PA-C  oseltamivir (TAMIFLU) 75 MG capsule Take 1 capsule (75 mg total) by mouth every 12 (twelve) hours. 08/05/17   Little, Ambrose Finlandachel Morgan, MD    sucralfate (CARAFATE) 1 g tablet Take 1 tablet (1 g total) by mouth 4 (four) times daily -  with meals and at bedtime. 01/05/17   Rise MuLeaphart, Kenneth T, PA-C    Family History History reviewed. No pertinent family history.  Social History Social History   Tobacco Use  . Smoking status: Former Smoker    Years: 5.00  . Smokeless tobacco: Never Used  Substance Use Topics  . Alcohol use: No  . Drug use: No     Allergies   Augmentin [amoxicillin-pot clavulanate]   Review of Systems Review of Systems All other systems reviewed and are negative except that which was mentioned in HPI   Physical Exam Updated Vital Signs BP (!) 138/105 (BP Location: Left Arm)   Pulse (!) 107   Temp 98.8 F (37.1 C) (Oral)   Resp 20   Ht 5\' 5"  (1.651 m)   Wt (!) 159 kg (350 lb 8.5 oz)   LMP 07/13/2017   SpO2 96%   BMI 58.33 kg/m   Physical Exam  Constitutional: She is oriented to person, place, and time. She appears well-developed and well-nourished. No distress.  HENT:  Head: Normocephalic and atraumatic.  Mouth/Throat: No oropharyngeal exudate.  Moist mucous membranes Mild erythema of posterior oropharynx, no tonsillar swelling or asymmetry  Eyes: Conjunctivae are normal.  Neck: Neck supple.  Cardiovascular: Regular rhythm and normal heart sounds. Tachycardia present.  No murmur heard. Pulmonary/Chest: Effort normal. No respiratory distress. She has wheezes.  Normal work of breathing, no tachypnea, expiratory wheezes bilaterally  Abdominal: Soft. Bowel sounds are normal. She exhibits no distension. There is no tenderness.  Musculoskeletal: She exhibits no edema.  Lymphadenopathy:    She has cervical adenopathy.  Neurological: She is alert and oriented to person, place, and time.  Fluent speech  Skin: Skin is warm and dry.  Psychiatric: She has a normal mood and affect. Judgment normal.  Nursing note and vitals reviewed.    ED Treatments / Results  Labs (all labs ordered are  listed, but only abnormal results are displayed) Labs Reviewed - No data to display  EKG  EKG Interpretation None       Radiology No results found.  Procedures Procedures (including critical care time)  Medications Ordered in ED Medications  dexamethasone (DECADRON) tablet 10 mg (not administered)  albuterol (PROVENTIL HFA;VENTOLIN HFA) 108 (90 Base) MCG/ACT inhaler 2 puff (not administered)  AEROCHAMBER PLUS FLO-VU MEDIUM MISC 1 each (not administered)  albuterol (PROVENTIL) (2.5 MG/3ML) 0.083% nebulizer solution (2.5 mg  Given 08/05/17 1046)  ipratropium-albuterol (DUONEB) 0.5-2.5 (3) MG/3ML nebulizer solution (3 mLs  Given 08/05/17 1046)     Initial Impression / Assessment and Plan / ED Course  I have reviewed the triage vital signs and the nursing notes.     Pt well appearing on exam, VS reassuring.  At my exam, she had already received DuoNeb and was mildly tachycardic but normal work of breathing, no evidence of respiratory distress.  Gave Decadron and albuterol inhaler to treat asthma exacerbation.  Symptoms likely related to viral URI.  Because of body aches and other symptoms, I discussed that her symptoms may be due to influenza.  I discussed risks and benefits of Tamiflu and explained that per CDC guidelines I would provide Tamiflu prescription due to her underlying comorbidity of asthma.  Ultimately patient will decide whether she wants to initiate Tamiflu course.  Extensively reviewed return precautions including any worsening respiratory symptoms.  Patient voiced understanding and discharged in satisfactory condition.  Final Clinical Impressions(s) / ED Diagnoses   Final diagnoses:  Mild intermittent asthma with exacerbation  Viral upper respiratory tract infection    ED Discharge Orders        Ordered    oseltamivir (TAMIFLU) 75 MG capsule  Every 12 hours     08/05/17 1204       Little, Ambrose Finland, MD 08/05/17 1210

## 2017-08-05 NOTE — ED Notes (Signed)
O2 sat 98%, HR 113, resp 22; NAD; pt sts out of MDI.

## 2017-08-05 NOTE — ED Notes (Signed)
ED Provider at bedside. 

## 2018-05-16 ENCOUNTER — Other Ambulatory Visit: Payer: Self-pay

## 2018-05-16 ENCOUNTER — Emergency Department (HOSPITAL_BASED_OUTPATIENT_CLINIC_OR_DEPARTMENT_OTHER)
Admission: EM | Admit: 2018-05-16 | Discharge: 2018-05-16 | Disposition: A | Discharge status: Home / Self Care | Payer: 59 | Attending: Emergency Medicine | Admitting: Emergency Medicine

## 2018-05-16 ENCOUNTER — Emergency Department (HOSPITAL_BASED_OUTPATIENT_CLINIC_OR_DEPARTMENT_OTHER): Payer: 59

## 2018-05-16 ENCOUNTER — Encounter (HOSPITAL_BASED_OUTPATIENT_CLINIC_OR_DEPARTMENT_OTHER): Payer: Self-pay | Admitting: Emergency Medicine

## 2018-05-16 DIAGNOSIS — Z87891 Personal history of nicotine dependence: Secondary | ICD-10-CM | POA: Diagnosis not present

## 2018-05-16 DIAGNOSIS — Y92410 Unspecified street and highway as the place of occurrence of the external cause: Secondary | ICD-10-CM | POA: Diagnosis not present

## 2018-05-16 DIAGNOSIS — Y939 Activity, unspecified: Secondary | ICD-10-CM | POA: Insufficient documentation

## 2018-05-16 DIAGNOSIS — M5489 Other dorsalgia: Secondary | ICD-10-CM | POA: Insufficient documentation

## 2018-05-16 DIAGNOSIS — J45909 Unspecified asthma, uncomplicated: Secondary | ICD-10-CM | POA: Insufficient documentation

## 2018-05-16 DIAGNOSIS — Z79899 Other long term (current) drug therapy: Secondary | ICD-10-CM | POA: Insufficient documentation

## 2018-05-16 DIAGNOSIS — M542 Cervicalgia: Secondary | ICD-10-CM | POA: Diagnosis present

## 2018-05-16 DIAGNOSIS — Y998 Other external cause status: Secondary | ICD-10-CM | POA: Insufficient documentation

## 2018-05-16 MED ORDER — NAPROXEN 500 MG PO TABS: 500.0000 mg | ORAL_TABLET | Freq: Two times a day (BID) | ORAL | 0 refills | Status: AC

## 2018-05-16 MED ORDER — METHOCARBAMOL 500 MG PO TABS
500.0000 mg | ORAL_TABLET | Freq: Once | ORAL | Status: AC
  Administered 2018-05-16: 500 mg via ORAL
  Filled 2018-05-16: qty 1
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
## 2018-05-16 NOTE — Discharge Instructions (Signed)
I have prescribed medication for your pain, please take 1 tablet twice a day as needed.You may also apply heat to the area of pain. Follow up with your primary care as needed.

## 2020-12-26 ENCOUNTER — Emergency Department (HOSPITAL_BASED_OUTPATIENT_CLINIC_OR_DEPARTMENT_OTHER): Payer: 59

## 2020-12-26 ENCOUNTER — Other Ambulatory Visit: Payer: Self-pay

## 2020-12-26 ENCOUNTER — Encounter (HOSPITAL_BASED_OUTPATIENT_CLINIC_OR_DEPARTMENT_OTHER): Payer: Self-pay | Admitting: Emergency Medicine

## 2020-12-26 ENCOUNTER — Emergency Department (HOSPITAL_BASED_OUTPATIENT_CLINIC_OR_DEPARTMENT_OTHER)
Admission: EM | Admit: 2020-12-26 | Discharge: 2020-12-26 | Disposition: A | Discharge status: Home / Self Care | Payer: 59 | Attending: Emergency Medicine | Admitting: Emergency Medicine

## 2020-12-26 DIAGNOSIS — S93402A Sprain of unspecified ligament of left ankle, initial encounter: Secondary | ICD-10-CM | POA: Insufficient documentation

## 2020-12-26 DIAGNOSIS — Y9339 Activity, other involving climbing, rappelling and jumping off: Secondary | ICD-10-CM | POA: Insufficient documentation

## 2020-12-26 DIAGNOSIS — Z87891 Personal history of nicotine dependence: Secondary | ICD-10-CM | POA: Insufficient documentation

## 2020-12-26 DIAGNOSIS — X509XXA Other and unspecified overexertion or strenuous movements or postures, initial encounter: Secondary | ICD-10-CM | POA: Diagnosis not present

## 2020-12-26 DIAGNOSIS — J45909 Unspecified asthma, uncomplicated: Secondary | ICD-10-CM | POA: Insufficient documentation

## 2020-12-26 DIAGNOSIS — S99912A Unspecified injury of left ankle, initial encounter: Secondary | ICD-10-CM | POA: Diagnosis present

## 2020-12-26 NOTE — ED Provider Notes (Signed)
[facility] EMERGENCY DEPARTMENT Provider Note   CSN: [id number] Arrival date & time: 12/26/20  1236     History Chief Complaint  Patient presents with   Ankle Pain    Elaine Miller is a 25 y.o. female.   Ankle Pain  Patient presents to the ED with complaints of left ankle pain.  Patient states she was jumping rope last night when she felt like she came down wrong on her left foot.  She felt like she heard a pop in the lateral aspect of her ankle.  Patient was still able to walk however today she has had increasing pain and soreness.  She denies any other injuries.  No knee pain.  No foot pain.  Past Medical History:  Diagnosis Date   Asthma    Obesity    Seasonal allergies     Patient Active Problem List   Diagnosis Date Noted   Reflux 06/21/2013   Gastritis 06/21/2013    History reviewed. No pertinent surgical history.   OB History   No obstetric history on file.     No family history on file.  Social History   Tobacco Use   Smoking status: Former    Years: 5.00    Pack years: 0.00    Types: Cigarettes   Smokeless tobacco: Never  Vaping Use   Vaping Use: Never used  Substance Use Topics   Alcohol use: No   Drug use: No    Home Medications Prior to Admission medications   Medication Sig Start Date End Date Taking? Authorizing Provider  albuterol (PROVENTIL HFA;VENTOLIN HFA) 108 (90 Base) MCG/ACT inhaler Inhale 1-2 puffs into the lungs every 6 (six) hours as needed for wheezing or shortness of breath. 07/03/16   Long, Arlyss Repress, MD  benzonatate (TESSALON) 100 MG capsule Take 1 capsule (100 mg total) by mouth 3 (three) times daily as needed for cough. 07/01/16   Fayrene Helper, PA-C  hydrochlorothiazide (HYDRODIURIL) 25 MG tablet Take by mouth. 02/11/18   [provider]  lidocaine (XYLOCAINE) 2 % solution Use as directed 20 mLs in the mouth or throat as needed for mouth pain. 01/06/17   Rise Mu, PA-C  omeprazole (PRILOSEC) 20 MG  capsule Take 1 capsule (20 mg total) by mouth daily. 01/05/17   Rise Mu, PA-C  oseltamivir (TAMIFLU) 75 MG capsule Take 1 capsule (75 mg total) by mouth every 12 (twelve) hours. 08/05/17   Little, Ambrose Finland, MD  pantoprazole (PROTONIX) 40 MG tablet Take twice daily for 3 months and then return to once daily. 02/11/18   [provider]  sucralfate (CARAFATE) 1 g tablet Take 1 tablet (1 g total) by mouth 4 (four) times daily -  with meals and at bedtime. 01/05/17   Rise Mu, PA-C    Allergies    Augmentin [amoxicillin-pot clavulanate]  Review of Systems   Review of Systems  All other systems reviewed and are negative.  Physical Exam Updated Vital Signs BP (!) 149/104 (BP Location: Right Arm)   Pulse 60   Temp 98.2 F (36.8 C) (Oral)   Resp 16   Ht 1.651 m (5\' 5" )   Wt (!) 147.4 kg   LMP 12/12/2020   SpO2 100%   BMI 54.08 kg/m   Physical Exam Vitals and nursing note reviewed.  Constitutional:      General: She is not in acute distress.    Appearance: She is well-developed.     Comments: Increase BMI  HENT:     Head: Normocephalic and atraumatic.     Right Ear: External ear normal.     Left Ear: External ear normal.  Eyes:     General: No scleral icterus.       Right eye: No discharge.        Left eye: No discharge.     Conjunctiva/sclera: Conjunctivae normal.  Neck:     Trachea: No tracheal deviation.  Cardiovascular:     Rate and Rhythm: Normal rate.  Pulmonary:     Effort: Pulmonary effort is normal. No respiratory distress.     Breath sounds: No stridor.  Abdominal:     General: There is no distension.  Musculoskeletal:        General: Tenderness present. No swelling or deformity.     Cervical back: Neck supple.     Left ankle: No swelling. Tenderness present over the lateral malleolus. No medial malleolus, base of 5th metatarsal or proximal fibula tenderness.  Skin:    General: Skin is warm and dry.     Findings: No rash.   Neurological:     Mental Status: She is alert.     Cranial Nerves: Cranial nerve deficit: no gross deficits.    ED Results / Procedures / Treatments   Labs (all labs ordered are listed, but only abnormal results are displayed) Labs Reviewed - No data to display  EKG None  Radiology DG Ankle Complete Left  Result Date: 12/26/2020 CLINICAL DATA:  Lateral left ankle pain and swelling after jumping rope last night. EXAM: LEFT ANKLE COMPLETE - 3+ VIEW COMPARISON:  Report from 06/08/2007 FINDINGS: No acute fracture identified. Bony ridging along the posterolateral tibial likely related to healed old fracture reported on 06/08/2007. Mild spurring of the distal distal tibial rim posteriorly. There is dorsal spurring of the talar head along with some mild dorsal spurring in the midfoot. Small plantar and Achilles calcaneal spurs are present. No definite tibiotalar joint effusion. Equivocal soft tissue edema below the lateral malleolus. IMPRESSION: 1. No acute bony findings. 2. Equivocal soft tissue swelling below the lateral malleolus. 3. Mild but advanced for age degenerative findings in the hindfoot and midfoot. Plantar calcaneal spurs noted. 4. If pain persists despite conservative therapy, MRI may be warranted for further characterization. Electronically Signed   By: Gaylyn Rong M.D.   On: 12/26/2020 13:43    Procedures Procedures   Medications Ordered in ED Medications - No data to display  ED Course  I have reviewed the triage vital signs and the nursing notes.  Pertinent labs & imaging results that were available during my care of the patient were reviewed by me and considered in my medical decision making (see chart for details).    MDM Rules/Calculators/A&P                          Patient presented to the ED for evaluation of ankle pain after a jump roping injury.  X-ray does not show evidence of fracture.  Presentation is consistent with a sprain.  Patient does have some  mild evidence of degenerative findings in the hindfoot and midfoot.  She is not having any pain in her feet right now.  Discussed these findings with the patient and recommended outpatient follow-up with orthopedics or sports medicine if she starts developing for pain or discomfort. Final Clinical Impression(s) / ED Diagnoses Final diagnoses:  Sprain of left ankle, unspecified ligament, initial encounter  Rx / DC Orders ED Discharge Orders     None        Linwood Dibbles, MD 12/26/20 1409

## 2020-12-26 NOTE — ED Triage Notes (Signed)
Pt c/o LT ankle pain after jumping rope last night

## 2020-12-26 NOTE — Discharge Instructions (Addendum)
Take over-the-counter medications as needed for pain.  Apply ice to help with pain and swelling.  The symptoms should improve over the next week.  The x-ray also showed findings suggestive of early arthritis.  Consider seeing a sports medicine or orthopedic doctor if you start having foot pain or discomfort.

## 2021-01-14 ENCOUNTER — Other Ambulatory Visit: Payer: Self-pay

## 2021-01-14 ENCOUNTER — Emergency Department (HOSPITAL_BASED_OUTPATIENT_CLINIC_OR_DEPARTMENT_OTHER)
Admission: EM | Admit: 2021-01-14 | Discharge: 2021-01-14 | Disposition: A | Discharge status: Home / Self Care | Payer: 59 | Attending: Emergency Medicine | Admitting: Emergency Medicine

## 2021-01-14 DIAGNOSIS — I10 Essential (primary) hypertension: Secondary | ICD-10-CM

## 2021-01-14 DIAGNOSIS — Z87891 Personal history of nicotine dependence: Secondary | ICD-10-CM | POA: Insufficient documentation

## 2021-01-14 DIAGNOSIS — R110 Nausea: Secondary | ICD-10-CM | POA: Insufficient documentation

## 2021-01-14 DIAGNOSIS — R42 Dizziness and giddiness: Secondary | ICD-10-CM | POA: Insufficient documentation

## 2021-01-14 DIAGNOSIS — J45909 Unspecified asthma, uncomplicated: Secondary | ICD-10-CM | POA: Diagnosis not present

## 2021-01-14 LAB — COMPREHENSIVE METABOLIC PANEL
ALT: 20 U/L (ref 0–44)
AST: 19 U/L (ref 15–41)
Albumin: 3.8 g/dL (ref 3.5–5.0)
Alkaline Phosphatase: 38 U/L (ref 38–126)
Anion gap: 5 (ref 5–15)
BUN: 11 mg/dL (ref 6–20)
CO2: 25 mmol/L (ref 22–32)
Calcium: 8.9 mg/dL (ref 8.9–10.3)
Chloride: 108 mmol/L (ref 98–111)
Creatinine, Ser: 0.73 mg/dL (ref 0.44–1.00)
GFR, Estimated: >60 mL/min (ref >60)
Glucose, Bld: 103 mg/dL — ABNORMAL HIGH (ref 70–99)
Potassium: 3.9 mmol/L (ref 3.5–5.1)
Sodium: 138 mmol/L (ref 135–145)
Total Bilirubin: 0.2 mg/dL — ABNORMAL LOW (ref 0.3–1.2)
Total Protein: 7.1 g/dL (ref 6.5–8.1)

## 2021-01-14 LAB — CBC WITH DIFFERENTIAL/PLATELET
Abs Immature Granulocytes: 0.02 10*3/uL (ref 0.00–0.07)
Basophils Absolute: 0.1 10*3/uL (ref 0.0–0.1)
Basophils Relative: 1 %
Eosinophils Absolute: 0.3 10*3/uL (ref 0.0–0.5)
Eosinophils Relative: 4 %
HCT: 40.3 % (ref 36.0–46.0)
Hemoglobin: 13.5 g/dL (ref 12.0–15.0)
Immature Granulocytes: 0 %
Lymphocytes Relative: 53 %
Lymphs Abs: 3.3 10*3/uL (ref 0.7–4.0)
MCH: 29.3 pg (ref 26.0–34.0)
MCHC: 33.5 g/dL (ref 30.0–36.0)
MCV: 87.4 fL (ref 80.0–100.0)
Monocytes Absolute: 0.4 10*3/uL (ref 0.1–1.0)
Monocytes Relative: 7 %
Neutro Abs: 2.2 10*3/uL (ref 1.7–7.7)
Neutrophils Relative %: 35 %
Platelets: 279 10*3/uL (ref 150–400)
RBC: 4.61 MIL/uL (ref 3.87–5.11)
RDW: 12.2 % (ref 11.5–15.5)
WBC: 6.3 10*3/uL (ref 4.0–10.5)
nRBC: 0 % (ref 0.0–0.2)

## 2021-01-14 LAB — URINALYSIS, ROUTINE W REFLEX MICROSCOPIC
Bilirubin Urine: NEGATIVE
Glucose, UA: NEGATIVE mg/dL
Ketones, ur: NEGATIVE mg/dL
Leukocytes,Ua: NEGATIVE
Nitrite: NEGATIVE
Protein, ur: NEGATIVE mg/dL
Specific Gravity, Urine: 1.01 (ref 1.005–1.030)
pH: 6.5 (ref 5.0–8.0)

## 2021-01-14 LAB — URINALYSIS, MICROSCOPIC (REFLEX)

## 2021-01-14 LAB — CBG MONITORING, ED: Glucose-Capillary: 105 mg/dL — ABNORMAL HIGH (ref 70–99)

## 2021-01-14 LAB — PREGNANCY, URINE: Preg Test, Ur: NEGATIVE

## 2021-01-14 MED ORDER — IPRATROPIUM-ALBUTEROL 0.5-2.5 (3) MG/3ML IN SOLN: 3.0000 mL | RESPIRATORY_TRACT | Status: DC

## 2021-01-14 MED ORDER — HYDROCHLOROTHIAZIDE 25 MG PO TABS: 25.0000 mg | ORAL_TABLET | Freq: Every day | ORAL | 0 refills | Status: AC

## 2021-01-14 MED ORDER — SODIUM CHLORIDE 0.9 % IV BOLUS
1000.0000 mL | Freq: Once | INTRAVENOUS | Status: AC
  Administered 2021-01-14: 1000 mL via INTRAVENOUS

## 2021-01-14 MED ORDER — MECLIZINE HCL 25 MG PO TABS: 25.0000 mg | ORAL_TABLET | Freq: Three times a day (TID) | ORAL | 0 refills | Status: AC | PRN

## 2021-01-14 MED ORDER — METHYLPREDNISOLONE SODIUM SUCC 125 MG IJ SOLR: 125.0000 mg | Freq: Once | INTRAMUSCULAR | Status: DC

## 2021-01-14 MED ORDER — MECLIZINE HCL 25 MG PO TABS
25.0000 mg | ORAL_TABLET | Freq: Once | ORAL | Status: AC
  Administered 2021-01-14: 25 mg via ORAL
  Filled 2021-01-14: qty 1

## 2021-01-14 NOTE — ED Notes (Signed)
Ambulated from lobby to room 1, SpO2 98-100%, HR 90, no DOE noted.  BBS CTA.  HFA albuterol not used per patient.

## 2021-01-14 NOTE — ED Triage Notes (Addendum)
Pt states she had a "wave of dizziness" & confusion at work and then became anxious & short of breath. States the dizziness feels like she is going to pass out. Denies chest pain.  Also asks if we can do a pregnancy test.

## 2021-01-14 NOTE — Discharge Instructions (Addendum)
You are seen today in the ED for vertigo.  You can take the medicine meclizine up to 3 times daily as needed.  I also sent a refill of your HCTZ.  Please take 25 mg once daily.  I would also like you to follow-up with your primary care doctor within the next 2 weeks for blood pressure recheck.  Blood pressure today was high in the ED, so it is important to follow-up and make sure it is controlled outpatient.  If things change or worsen please return back to the ED for further evaluation.

## 2021-01-14 NOTE — ED Provider Notes (Signed)
[facility] EMERGENCY DEPARTMENT Provider Note   CSN: [id number] Arrival date & time: 01/14/21  0934     History Chief Complaint  Patient presents with   Dizziness    Elaine Miller is a 25 y.o. female.  Patient presents with acute episode of lightheadedness.  States it started acutely when she was sitting down at work.  It was worsened by standing upright.  Feels like she is going to faint, does not feel like the room is spinning.  There is no associated chest pain, but she felt short of breath and anxious when it occurred.  Described feeling disoriented, as if she was intoxicated.  She not have any leg swelling.  Patient is not on any birth control.  Her last menstrual cycle was the end of June.  She denies any recent surgeries, no recent travel.  No history of blood clots.  She denies any pain anywhere.  No recent head trauma.  Dizziness Associated symptoms: nausea   Associated symptoms: no headaches, no vomiting and no weakness       Past Medical History:  Diagnosis Date   Asthma    Obesity    Seasonal allergies     Patient Active Problem List   Diagnosis Date Noted   Reflux 06/21/2013   Gastritis 06/21/2013    No past surgical history on file.   OB History   No obstetric history on file.     No family history on file.  Social History   Tobacco Use   Smoking status: Former    Years: 5.00    Types: Cigarettes   Smokeless tobacco: Never  Vaping Use   Vaping Use: Never used  Substance Use Topics   Alcohol use: No   Drug use: No    Home Medications Prior to Admission medications   Medication Sig Start Date End Date Taking? Authorizing Provider  albuterol (PROVENTIL HFA;VENTOLIN HFA) 108 (90 Base) MCG/ACT inhaler Inhale 1-2 puffs into the lungs every 6 (six) hours as needed for wheezing or shortness of breath. 07/03/16   Long, Arlyss Repress, MD  benzonatate (TESSALON) 100 MG capsule Take 1 capsule (100 mg total) by mouth 3 (three) times daily as needed  for cough. 07/01/16   Fayrene Helper, PA-C  hydrochlorothiazide (HYDRODIURIL) 25 MG tablet Take by mouth. 02/11/18   [provider]  lidocaine (XYLOCAINE) 2 % solution Use as directed 20 mLs in the mouth or throat as needed for mouth pain. 01/06/17   Rise Mu, PA-C  omeprazole (PRILOSEC) 20 MG capsule Take 1 capsule (20 mg total) by mouth daily. 01/05/17   Rise Mu, PA-C  oseltamivir (TAMIFLU) 75 MG capsule Take 1 capsule (75 mg total) by mouth every 12 (twelve) hours. 08/05/17   Little, Ambrose Finland, MD  pantoprazole (PROTONIX) 40 MG tablet Take twice daily for 3 months and then return to once daily. 02/11/18   [provider]  sucralfate (CARAFATE) 1 g tablet Take 1 tablet (1 g total) by mouth 4 (four) times daily -  with meals and at bedtime. 01/05/17   Rise Mu, PA-C    Allergies    Augmentin [amoxicillin-pot clavulanate]  Review of Systems   Review of Systems  Constitutional:  Negative for diaphoresis, fatigue and fever.  Gastrointestinal:  Positive for nausea. Negative for abdominal pain and vomiting.  Genitourinary:  Negative for dysuria and flank pain.  Neurological:  Positive for light-headedness. Negative for dizziness, seizures, syncope, speech difficulty, weakness and headaches.  Physical Exam Updated Vital Signs BP (!) 181/115 (BP Location: Left Arm)   Pulse 85   Temp 98 F (36.7 C) (Oral)   Resp (!) 22   Ht 5\' 5"  (1.651 m)   Wt (!) 147.4 kg   LMP 12/17/2020 (Approximate)   SpO2 100%   BMI 54.08 kg/m   Physical Exam Vitals and nursing note reviewed. Exam conducted with a chaperone present.  Constitutional:      Appearance: Normal appearance. She is obese.  HENT:     Head: Normocephalic and atraumatic.  Eyes:     General: No scleral icterus.       Right eye: No discharge.        Left eye: No discharge.     Extraocular Movements: Extraocular movements intact.     Pupils: Pupils are equal, round, and reactive to light.   Cardiovascular:     Rate and Rhythm: Normal rate and regular rhythm.     Pulses: Normal pulses.     Heart sounds: Normal heart sounds. No murmur heard.   No friction rub. No gallop.  Pulmonary:     Effort: Pulmonary effort is normal. No respiratory distress.     Breath sounds: Normal breath sounds.  Abdominal:     General: Abdomen is flat. Bowel sounds are normal. There is no distension.     Palpations: Abdomen is soft.     Tenderness: There is no abdominal tenderness.  Musculoskeletal:     Comments: No leg swelling, legs symmetric bilaterally   Skin:    General: Skin is warm and dry.     Coloration: Skin is not jaundiced.  Neurological:     Mental Status: She is alert. Mental status is at baseline.     Coordination: Coordination normal.     Comments: Cranial nerves III through XII grossly intact.  Grip strength is equal bilaterally.  Leg strength is equal bilaterally.  Sensation to light touch is intact bilaterally.  Negative Dix-Hallpike.  Lightheadedness worsened from supine to upright position.    ED Results / Procedures / Treatments   Labs (all labs ordered are listed, but only abnormal results are displayed) Labs Reviewed - No data to display  EKG None  Radiology No results found.  Procedures Procedures   Medications Ordered in ED Medications - No data to display  ED Course  I have reviewed the triage vital signs and the nursing notes.  Pertinent labs & imaging results that were available during my care of the patient were reviewed by me and considered in my medical decision making (see chart for details).  Clinical Course as of 01/14/21 1217  Mon Jan 14, 2021  1214 Patient reports feeling improved with fluids and meclizine.  [HS]    Clinical Course User Index [HS] 1215, PA-C   MDM Rules/Calculators/A&P                         Patient is hypertensive, takes medication for that.  She is not having any chest pain or shortness of breath, she is perc  negative.  Low suspicion, but will get a D-dimer.  We will also check for pregnancy as ectopic is on the differential.  Orthostatic changes could also be partially to blame.  Patient reports she been feeling nauseated the last week and has not been drinking as much as typical.  We will give fluids and reassess.  Reevaluation: Patient reports improvement while in the ED.  The meclizine  and fluids seem to have helped.  I will prescribe the patient meclizine.  Her blood pressure is also been high in the ED which we discussed.  She has not been taking her blood pressure medicine for the last year.  I have sent a 1 month refill supply to her pharmacy with instructions that she needs to follow-up with her primary care doctor in the next week or 2.  Patient voices understanding and agrees.  Patient does not have an ectopic pregnancy.  There is no electrolyte derangement.  She is not anemic.  No elevated white blood cell count concerning for infectious etiology.  She is not tachycardic.  She is PERC negative.  I suspect her vertigo is not related to emergent pathology.  Will prescribe outpatient medicine.  Discussed patient's work-up and return precautions.  Patient verbalized understanding and agreement with the plan.  Discussed HPI, physical exam and plan of care for this patient with attending Marianna Fuss. The attending physician evaluated this patient as part of a shared visit and agrees with plan of care.   Final Clinical Impression(s) / ED Diagnoses Final diagnoses:  None    Rx / DC Orders ED Discharge Orders     None        Theron Arista, Cordelia Poche 01/14/21 1218    Milagros Loll, MD 01/14/21 1534

## 2021-02-17 ENCOUNTER — Other Ambulatory Visit: Payer: Self-pay

## 2021-02-17 ENCOUNTER — Emergency Department (HOSPITAL_BASED_OUTPATIENT_CLINIC_OR_DEPARTMENT_OTHER)
Admission: EM | Admit: 2021-02-17 | Discharge: 2021-02-17 | Disposition: A | Discharge status: Home / Self Care | Payer: 59 | Attending: Emergency Medicine | Admitting: Emergency Medicine

## 2021-02-17 ENCOUNTER — Encounter (HOSPITAL_BASED_OUTPATIENT_CLINIC_OR_DEPARTMENT_OTHER): Payer: Self-pay | Admitting: *Deleted

## 2021-02-17 DIAGNOSIS — J45909 Unspecified asthma, uncomplicated: Secondary | ICD-10-CM | POA: Insufficient documentation

## 2021-02-17 DIAGNOSIS — R590 Localized enlarged lymph nodes: Secondary | ICD-10-CM | POA: Insufficient documentation

## 2021-02-17 DIAGNOSIS — J039 Acute tonsillitis, unspecified: Secondary | ICD-10-CM | POA: Diagnosis not present

## 2021-02-17 DIAGNOSIS — J029 Acute pharyngitis, unspecified: Secondary | ICD-10-CM | POA: Diagnosis present

## 2021-02-17 DIAGNOSIS — Z87891 Personal history of nicotine dependence: Secondary | ICD-10-CM | POA: Insufficient documentation

## 2021-02-17 LAB — GROUP A STREP BY PCR: Group A Strep by PCR: NOT DETECTED

## 2021-02-17 MED ORDER — DEXAMETHASONE SODIUM PHOSPHATE 10 MG/ML IJ SOLN
10.0000 mg | Freq: Once | INTRAMUSCULAR | Status: AC
  Administered 2021-02-17: 10 mg via INTRAMUSCULAR
  Filled 2021-02-17: qty 1

## 2021-02-17 MED ORDER — AZITHROMYCIN 250 MG PO TABS
500.0000 mg | ORAL_TABLET | Freq: Once | ORAL | Status: AC
  Administered 2021-02-17: 500 mg via ORAL
  Filled 2021-02-17: qty 2

## 2021-02-17 MED ORDER — KETOROLAC TROMETHAMINE 15 MG/ML IJ SOLN
60.0000 mg | Freq: Once | INTRAMUSCULAR | Status: AC
  Administered 2021-02-17: 60 mg via INTRAMUSCULAR

## 2021-02-17 MED ORDER — AZITHROMYCIN 250 MG PO TABS: 250.0000 mg | ORAL_TABLET | Freq: Every day | ORAL | 0 refills | Status: DC

## 2021-02-17 NOTE — ED Triage Notes (Signed)
Pt is here for sore throat with increased pain with swallowing since yesterday, pt tonsils are enlarged and white spots are visible on them, pt has been having a low grade temp.

## 2021-02-17 NOTE — Discharge Instructions (Addendum)
Please read and follow all provided instructions.  Your diagnoses today include:  1. Tonsillitis     Tests performed today include: Strep test: was negative for strep throat Vital signs. See below for your results today.   Medications prescribed:  Azithromycin - antibiotic for respiratory infection  You have been prescribed an antibiotic medicine: take the entire course of medicine even if you are feeling better. Stopping early can cause the antibiotic not to work.  Take any medications prescribed only as directed.   Home care instructions:  Please read the educational materials provided and follow any instructions contained in this packet.  Follow-up instructions: Please follow-up with your primary care provider as needed for further evaluation of your symptoms.  Return instructions:  Please return to the Emergency Department if you experience worsening symptoms.  Return if you have worsening problems swallowing, your neck becomes swollen, you cannot swallow your saliva or your voice becomes muffled.  Return with high persistent fever, persistent vomiting, or if you have trouble breathing.  Please return if you have any other emergent concerns.  Additional Information:  Your vital signs today were: BP (!) 144/100 (BP Location: Left Arm)   Pulse 95   Temp 100.2 F (37.9 C) (Oral)   Resp 18   Wt (!) 147.4 kg   LMP 02/17/2021   SpO2 97%   BMI 54.08 kg/m  If your blood pressure (BP) was elevated above 135/85 this visit, please have this repeated by your doctor within one month. --------------

## 2021-02-17 NOTE — ED Provider Notes (Signed)
[facility] EMERGENCY DEPARTMENT Provider Note   CSN: [id number] Arrival date & time: 02/17/21  1836     History Chief Complaint  Patient presents with   Sore Throat    Elaine Miller is a 25 y.o. female.  Patient presents emergency department for evaluation of sore throat for the past 2 days with radiation of pain to the right ear.  Patient has been having a low-grade temperature, 100.2 F on arrival.  She reports swollen tonsils.  No chest pain, cough.  No vomiting.  Pain is worse with swallowing.  No known sick contacts.      Past Medical History:  Diagnosis Date   Asthma    Obesity    Seasonal allergies     Patient Active Problem List   Diagnosis Date Noted   Reflux 06/21/2013   Gastritis 06/21/2013    History reviewed. No pertinent surgical history.   OB History   No obstetric history on file.     No family history on file.  Social History   Tobacco Use   Smoking status: Former    Years: 5.00    Types: Cigarettes   Smokeless tobacco: Never  Vaping Use   Vaping Use: Never used  Substance Use Topics   Alcohol use: No   Drug use: No    Home Medications Prior to Admission medications   Medication Sig Start Date End Date Taking? Authorizing Provider  albuterol (PROVENTIL HFA;VENTOLIN HFA) 108 (90 Base) MCG/ACT inhaler Inhale 1-2 puffs into the lungs every 6 (six) hours as needed for wheezing or shortness of breath. 07/03/16   Long, Arlyss Repress, MD  benzonatate (TESSALON) 100 MG capsule Take 1 capsule (100 mg total) by mouth 3 (three) times daily as needed for cough. 07/01/16   Fayrene Helper, PA-C  hydrochlorothiazide (HYDRODIURIL) 25 MG tablet Take 1 tablet (25 mg total) by mouth daily. 01/14/21   Theron Arista, PA-C  lidocaine (XYLOCAINE) 2 % solution Use as directed 20 mLs in the mouth or throat as needed for mouth pain. 01/06/17   Rise Mu, PA-C  meclizine (ANTIVERT) 25 MG tablet Take 1 tablet (25 mg total) by mouth 3 (three) times daily as  needed for dizziness. 01/14/21   Theron Arista, PA-C  omeprazole (PRILOSEC) 20 MG capsule Take 1 capsule (20 mg total) by mouth daily. 01/05/17   Rise Mu, PA-C  oseltamivir (TAMIFLU) 75 MG capsule Take 1 capsule (75 mg total) by mouth every 12 (twelve) hours. 08/05/17   Little, Ambrose Finland, MD  pantoprazole (PROTONIX) 40 MG tablet Take twice daily for 3 months and then return to once daily. 02/11/18   [provider]  sucralfate (CARAFATE) 1 g tablet Take 1 tablet (1 g total) by mouth 4 (four) times daily -  with meals and at bedtime. 01/05/17   Rise Mu, PA-C    Allergies    Augmentin [amoxicillin-pot clavulanate]  Review of Systems   Review of Systems  Constitutional:  Positive for chills and fever. Negative for fatigue.  HENT:  Positive for ear pain and sore throat. Negative for congestion, rhinorrhea and sinus pressure.   Eyes:  Negative for redness.  Respiratory:  Negative for cough and wheezing.   Gastrointestinal:  Negative for abdominal pain, diarrhea, nausea and vomiting.  Genitourinary:  Negative for dysuria.  Musculoskeletal:  Negative for myalgias and neck stiffness.  Skin:  Negative for rash.  Neurological:  Negative for headaches.  Hematological:  Negative for adenopathy.   Physical Exam  Updated Vital Signs BP (!) 144/100 (BP Location: Left Arm)   Pulse 95   Temp 100.2 F (37.9 C) (Oral)   Resp 18   Wt (!) 147.4 kg   LMP 02/17/2021   SpO2 97%   BMI 54.08 kg/m   Physical Exam Vitals and nursing note reviewed.  Constitutional:      Appearance: She is well-developed.  HENT:     Head: Normocephalic and atraumatic.     Jaw: No trismus.     Right Ear: Tympanic membrane, ear canal and external ear normal.     Left Ear: Tympanic membrane, ear canal and external ear normal.     Nose: Nose normal. No mucosal edema or rhinorrhea.     Mouth/Throat:     Mouth: Mucous membranes are moist. Mucous membranes are not dry. No oral lesions.      Pharynx: Uvula midline. No posterior oropharyngeal erythema or uvula swelling.     Tonsils: Tonsillar exudate present. No tonsillar abscesses. 3+ on the right. 3+ on the left.  Eyes:     General:        Right eye: No discharge.        Left eye: No discharge.     Conjunctiva/sclera: Conjunctivae normal.  Cardiovascular:     Rate and Rhythm: Normal rate and regular rhythm.     Heart sounds: Normal heart sounds.  Pulmonary:     Effort: Pulmonary effort is normal. No respiratory distress.     Breath sounds: Normal breath sounds. No wheezing or rales.  Abdominal:     Palpations: Abdomen is soft.     Tenderness: There is no abdominal tenderness.  Musculoskeletal:     Cervical back: Normal range of motion and neck supple.  Lymphadenopathy:     Cervical: Cervical adenopathy present.  Skin:    General: Skin is warm and dry.  Neurological:     Mental Status: She is alert.  Psychiatric:        Mood and Affect: Mood normal.    ED Results / Procedures / Treatments   Labs (all labs ordered are listed, but only abnormal results are displayed) Labs Reviewed  GROUP A STREP BY PCR    EKG None  Radiology No results found.  Procedures Procedures   Medications Ordered in ED Medications  ketorolac (TORADOL) 15 MG/ML injection 60 mg (has no administration in time range)  dexamethasone (DECADRON) injection 10 mg (has no administration in time range)  azithromycin (ZITHROMAX) tablet 500 mg (has no administration in time range)    ED Course  I have reviewed the triage vital signs and the nursing notes.  Pertinent labs & imaging results that were available during my care of the patient were reviewed by me and considered in my medical decision making (see chart for details).  Patient seen and examined.  Strep negative, however Cent[facility] 4/4.  Will treat with IM Toradol and Decadron for pain and discomfort.  Will start on azithromycin due to penicillin allergy.  Encouraged good hydration at  home with soft foods.  Encouraged return to the emergency department with difficulty swallowing, vomiting, dehydration.  She verbalizes understanding and agrees with plan.  Vital signs reviewed and are as follows: BP (!) 144/100 (BP Location: Left Arm)   Pulse 95   Temp 100.2 F (37.9 C) (Oral)   Resp 18   Wt (!) 147.4 kg   LMP 02/17/2021   SpO2 97%   BMI 54.08 kg/m     MDM Rules/Calculators/A&P  Patient with tonsillitis.  Treatment plan as above.  She appears well, nontoxic.  Does not appear appreciably dehydrated at this point.     Final Clinical Impression(s) / ED Diagnoses Final diagnoses:  Tonsillitis    Rx / DC Orders ED Discharge Orders          Ordered    azithromycin (ZITHROMAX) 250 MG tablet  Daily        02/17/21 2045             Renne Crigler, Cordelia Poche 02/17/21 2045    Milagros Loll, MD 02/19/21 [id number]

## 2021-11-11 ENCOUNTER — Encounter (HOSPITAL_BASED_OUTPATIENT_CLINIC_OR_DEPARTMENT_OTHER): Payer: Self-pay | Admitting: Emergency Medicine

## 2021-11-11 ENCOUNTER — Other Ambulatory Visit: Payer: Self-pay

## 2021-11-11 DIAGNOSIS — R1032 Left lower quadrant pain: Secondary | ICD-10-CM | POA: Insufficient documentation

## 2021-11-11 DIAGNOSIS — Z87891 Personal history of nicotine dependence: Secondary | ICD-10-CM | POA: Diagnosis not present

## 2021-11-11 DIAGNOSIS — M25552 Pain in left hip: Secondary | ICD-10-CM | POA: Insufficient documentation

## 2021-11-11 DIAGNOSIS — R202 Paresthesia of skin: Secondary | ICD-10-CM | POA: Insufficient documentation

## 2021-11-11 DIAGNOSIS — M545 Low back pain, unspecified: Secondary | ICD-10-CM | POA: Insufficient documentation

## 2021-11-11 DIAGNOSIS — J45909 Unspecified asthma, uncomplicated: Secondary | ICD-10-CM | POA: Insufficient documentation

## 2021-11-11 NOTE — ED Triage Notes (Signed)
Left hip pain x 2 days no know cause. Denies injury. States woke up yesterday morning with excruciating pain and it hurts to turn left foot and to lift left leg. What is ambulatory to triage.  ?

## 2021-11-12 ENCOUNTER — Emergency Department (HOSPITAL_BASED_OUTPATIENT_CLINIC_OR_DEPARTMENT_OTHER): Payer: 59

## 2021-11-12 ENCOUNTER — Emergency Department (HOSPITAL_BASED_OUTPATIENT_CLINIC_OR_DEPARTMENT_OTHER)
Admission: EM | Admit: 2021-11-12 | Discharge: 2021-11-12 | Disposition: A | Discharge status: Home / Self Care | Payer: 59 | Attending: Emergency Medicine | Admitting: Emergency Medicine

## 2021-11-12 ENCOUNTER — Other Ambulatory Visit (HOSPITAL_BASED_OUTPATIENT_CLINIC_OR_DEPARTMENT_OTHER): Payer: Self-pay

## 2021-11-12 DIAGNOSIS — R1032 Left lower quadrant pain: Secondary | ICD-10-CM

## 2021-11-12 LAB — PREGNANCY, URINE: Preg Test, Ur: NEGATIVE

## 2021-11-12 MED ORDER — NAPROXEN 375 MG PO TABS
ORAL_TABLET | ORAL | 0 refills | Status: DC
  Filled 2021-11-12: qty 20, 10d supply, fill #0

## 2021-11-12 MED ORDER — NAPROXEN 250 MG PO TABS
500.0000 mg | ORAL_TABLET | Freq: Once | ORAL | Status: AC
  Administered 2021-11-12: 500 mg via ORAL
  Filled 2021-11-12: qty 2

## 2021-11-12 MED ORDER — HYDROCODONE-ACETAMINOPHEN 5-325 MG PO TABS
1.0000 | ORAL_TABLET | Freq: Once | ORAL | Status: AC
  Administered 2021-11-12: 1 via ORAL
  Filled 2021-11-12: qty 1

## 2021-11-12 MED ORDER — HYDROCODONE-ACETAMINOPHEN 5-325 MG PO TABS
1.0000 | ORAL_TABLET | Freq: Four times a day (QID) | ORAL | 0 refills | Status: AC | PRN
  Filled 2021-11-12: qty 8, 2d supply, fill #0

## 2021-11-12 NOTE — ED Notes (Signed)
Pt NAD, a/ox4. Pt verbalizes understanding of all DC and f/u instructions. All questions answered. Pt wheeled by spouse in w/c to lobby at DC. ? ?

## 2021-11-12 NOTE — ED Notes (Signed)
Patient transported to X-ray 

## 2021-11-12 NOTE — ED Provider Notes (Signed)
? ?[facility] ?Provider Note: Lowella Dell, MD, FACEP ? ?CSN: [id number] ?MRN: [id number] ?ARRIVAL: 11/11/21 at 2247 ?ROOM: [facility] ? ? ?CHIEF COMPLAINT  ?Hip Pain ? ? ?HISTORY OF PRESENT ILLNESS  ?11/12/21 1:13 AM ?Elaine Miller is a 26 y.o. female who woke up Sunday morning (the day before yesterday) with pain in her left anterior groin fold.  The pain radiates down her leg with some paresthesias of the anterolateral aspect of the leg.  She denies any known injury.  She rates the pain as a 7 out of 10, sharp in nature.  It is worse with movement of the left hip or with palpation of her groin fold.  It is also somewhat worsened with movement of her lower back.  She is having no back pain, specifically no sacroiliac pain. ? ? ?Past Medical History:  ?Diagnosis Date  ? Asthma   ? Obesity   ? Seasonal allergies   ? ? ?History reviewed. No pertinent surgical history. ? ?No family history on file. ? ?Social History  ? ?Tobacco Use  ? Smoking status: Former  ?  Years: 5.00  ?  Types: Cigarettes  ? Smokeless tobacco: Never  ?Vaping Use  ? Vaping Use: Never used  ?Substance Use Topics  ? Alcohol use: No  ? Drug use: No  ? ? ?Prior to Admission medications   ?Medication Sig Start Date End Date Taking? Authorizing Provider  ?HYDROcodone-acetaminophen (NORCO) 5-325 MG tablet Take 1 tablet by mouth every 6 (six) hours as needed for severe pain. 11/12/21  Yes Nayson Traweek, MD  ?naproxen (NAPROSYN) 375 MG tablet Take 1 tablet twice daily as needed for pain. 11/12/21  Yes Niara Bunker, MD  ?hydrochlorothiazide (HYDRODIURIL) 25 MG tablet Take 1 tablet (25 mg total) by mouth daily. 01/14/21   Theron Arista, PA-C  ?meclizine (ANTIVERT) 25 MG tablet Take 1 tablet (25 mg total) by mouth 3 (three) times daily as needed for dizziness. 01/14/21   Theron Arista, PA-C  ?pantoprazole (PROTONIX) 40 MG tablet Take twice daily for 3 months and then return to once daily. 02/11/18   [provider]  ? ? ?Allergies ?Augmentin  [amoxicillin-pot clavulanate] ? ? ?REVIEW OF SYSTEMS  ?Negative except as noted here or in the History of Present Illness. ? ? ?PHYSICAL EXAMINATION  ?Initial Vital Signs ?Blood pressure (!) 145/95, pulse 86, temperature 98.5 ?F (36.9 ?C), temperature source Oral, resp. rate 18, height 5\' 5"  (1.651 m), weight (!) 141.5 kg, last menstrual period 10/12/2021, SpO2 99 %. ? ?Examination ?General: Well-developed, well-nourished female in no acute distress; appearance consistent with age of record ?HENT: normocephalic; atraumatic ?Eyes: Normal appearance ?Neck: supple ?Heart: regular rate and rhythm ?Lungs: clear to auscultation bilaterally ?Abdomen: soft; nondistended; nontender; bowel sounds present ?Back: No lumbar or SI tenderness ?Extremities: No deformity; tenderness on palpation of left groin fold with pain on movement of left hip ?Neurologic: Awake, alert and oriented; motor function intact in all extremities and symmetric; no facial droop; mildly altered sensation of anterolateral aspect of left lower extremity ?Skin: Warm and dry ?Psychiatric: Normal mood and affect ? ? ?RESULTS  ?Summary of this visit's results, reviewed and interpreted by myself: ? ? EKG Interpretation ? ?Date/Time:    ?Ventricular Rate:    ?PR Interval:    ?QRS Duration:   ?QT Interval:    ?QTC Calculation:   ?R Axis:     ?Text Interpretation:   ?  ? ?  ? ?Laboratory Studies: ?Results for orders placed or performed  during the hospital encounter of 11/12/21 (from the past 24 hour(s))  ?Pregnancy, urine     Status: None  ? Collection Time: 11/12/21  1:22 AM  ?Result Value Ref Range  ? Preg Test, Ur NEGATIVE NEGATIVE  ? ?Imaging Studies: ?DG Hip Unilat W or Wo Pelvis 2-3 Views Left ? ?Result Date: 11/12/2021 ?CLINICAL DATA:  Left hip pain x2 days EXAM: DG HIP (WITH OR WITHOUT PELVIS) 2-3V LEFT COMPARISON:  None Available. FINDINGS: No fracture or dislocation is seen. Bilateral hip joint spaces are preserved. Visualized bony pelvis appears intact.  IMPRESSION: Negative. Electronically Signed   By: Charline Bills M.D.   On: 11/12/2021 02:14   ? ?ED COURSE and MDM  ?Nursing notes, initial and subsequent vitals signs, including pulse oximetry, reviewed and interpreted by myself. ? ?Vitals:  ? 11/11/21 2301 11/11/21 2303  ?BP:  (!) 145/95  ?Pulse:  86  ?Resp:  18  ?Temp:  98.5 ?F (36.9 ?C)  ?TempSrc:  Oral  ?SpO2:  99%  ?Weight: (!) 141.5 kg   ?Height: 5\' 5"  (1.651 m)   ? ?Medications  ?HYDROcodone-acetaminophen (NORCO/VICODIN) 5-325 MG per tablet 1 tablet (has no administration in time range)  ?naproxen (NAPROSYN) tablet 500 mg (has no administration in time range)  ? ?It is unclear if this represents musculoskeletal pain or pain from her femoral nerve given her paresthesias.  Her symptoms are not in a dermatomal pattern.  There is no tenderness over the trochanteric bursa.  She has no SI tenderness or pain to suggest sacroiliitis which often presents with pain radiating to the groin.  We will treat her pain and refer to sports medicine. ? ? ?PROCEDURES  ?Procedures ? ? ?ED DIAGNOSES  ? ?  ICD-10-CM   ?1. Pain in the groin, left  R10.32   ?  ? ? ? ?  ? , MD ?11/12/21 0248 ? ?

## 2021-11-12 NOTE — ED Notes (Signed)
Pt NAD, a/ox4, c/o left hip pain x 2 days. Denies injury, states woke up with it. Pain with lifting leg or turning. +pmsc ?

## 2021-11-13 ENCOUNTER — Ambulatory Visit: Payer: 59 | Admitting: Family Medicine

## 2021-11-13 ENCOUNTER — Encounter: Payer: Self-pay | Admitting: Family Medicine

## 2021-11-13 VITALS — BP 144/100 | Ht 65.0 in | Wt 312.0 lb

## 2021-11-13 DIAGNOSIS — M5416 Radiculopathy, lumbar region: Secondary | ICD-10-CM | POA: Diagnosis not present

## 2021-11-13 MED ORDER — PREDNISONE 5 MG PO TABS: ORAL_TABLET | ORAL | 0 refills | Status: DC

## 2021-11-13 NOTE — Progress Notes (Signed)
?  Elaine Miller - 26 y.o. female MRN [id number]  Date of birth: [birth year] ? ?SUBJECTIVE:  Including CC & ROS.  ?No chief complaint on file. ? ? ?Elaine Miller is a 26 y.o. female that is  Presenting with acute left-sided lateral leg pain.  The pain starts at the hip and goes down to the foot.  The pain has been ongoing for the past few days.  No inciting event or trauma.  No history of surgery.  The pain medicine has been helping. ? ?Review of the emergency department note from 5/16 shows she was provided Norco and naproxen. ?Independent review of the left hip x-ray from 5/16 shows no acute changes. ? ?Review of Systems ?See HPI  ? ?HISTORY: Past Medical, Surgical, Social, and Family History Reviewed & Updated per EMR.   ?Pertinent Historical Findings include: ? ?Past Medical History:  ?Diagnosis Date  ? Asthma   ? Obesity   ? Seasonal allergies   ? ? ?History reviewed. No pertinent surgical history. ? ? ?PHYSICAL EXAM:  ?VS: BP (!) 144/100 (BP Location: Left Arm, Patient Position: Sitting)   Ht 5\' 5"  (1.651 m)   Wt (!) 312 lb (141.5 kg)   BMI 51.92 kg/m?  ?Physical Exam ?Gen: NAD, alert, cooperative with exam, well-appearing ?MSK:  ?Neurovascularly intact   ? ? ? ? ?ASSESSMENT & PLAN:  ? ?Lumbar radiculopathy ?Acutely occurring.  Symptoms most consistent with a radicular origin.  Having pain and altered sensation in the lateral leg. ?-Counseled on home exercise therapy and supportive care. ?-Counseled on Norco. ?-Prednisone. ?-Could consider further imaging or physical therapy. ? ? ? ? ?

## 2021-11-13 NOTE — Patient Instructions (Signed)
Nice to meet you Please try heat  Please try the exercises  Please use the pain medicine as needed  Please send me a message in MyChart with any questions or updates.  Please see me back in 2-3 weeks.   --Dr. Kensey Luepke  

## 2021-11-13 NOTE — Assessment & Plan Note (Signed)
Acutely occurring.  Symptoms most consistent with a radicular origin.  Having pain and altered sensation in the lateral leg. ?-Counseled on home exercise therapy and supportive care. ?-Counseled on Norco. ?-Prednisone. ?-Could consider further imaging or physical therapy. ?

## 2021-11-14 ENCOUNTER — Encounter: Payer: Self-pay | Admitting: Family Medicine

## 2021-12-06 ENCOUNTER — Ambulatory Visit: Payer: 59 | Admitting: Family Medicine

## 2021-12-06 ENCOUNTER — Other Ambulatory Visit (HOSPITAL_BASED_OUTPATIENT_CLINIC_OR_DEPARTMENT_OTHER): Payer: Self-pay

## 2021-12-06 ENCOUNTER — Encounter: Payer: Self-pay | Admitting: Family Medicine

## 2021-12-06 VITALS — BP 150/98 | Ht 65.0 in | Wt 312.0 lb

## 2021-12-06 DIAGNOSIS — M5416 Radiculopathy, lumbar region: Secondary | ICD-10-CM

## 2021-12-06 MED ORDER — MELOXICAM 15 MG PO TABS
ORAL_TABLET | ORAL | 1 refills | Status: DC
  Filled 2021-12-06: qty 30, 30d supply, fill #0

## 2021-12-06 NOTE — Patient Instructions (Signed)
Good to see you Please use heat as needed  I have made a referral to physical therapy  We'll stop the naproxen and start mobic   Please send me a message in MyChart with any questions or updates.  Please see me back in 4-6 weeks or as needed if better.   --Dr. Jordan Likes

## 2021-12-06 NOTE — Progress Notes (Signed)
  Elaine Miller - 26 y.o. female MRN [id number]  Date of birth: Nov 12, 1995  SUBJECTIVE:  Including CC & ROS.  No chief complaint on file.   Elaine Miller is a 26 y.o. female that is following up for her radicular pain.  She is having some stiffness in the lower back but the radicular pain has improved.   Review of Systems See HPI   HISTORY: Past Medical, Surgical, Social, and Family History Reviewed & Updated per EMR.   Pertinent Historical Findings include:  Past Medical History:  Diagnosis Date   Asthma    Obesity    Seasonal allergies     History reviewed. No pertinent surgical history.   PHYSICAL EXAM:  VS: BP (!) 150/98 (BP Location: Left Arm, Patient Position: Sitting)   Ht 5\' 5"  (1.651 m)   Wt (!) 312 lb (141.5 kg)   LMP 10/12/2021 (Approximate)   BMI 51.92 kg/m  Physical Exam Gen: NAD, alert, cooperative with exam, well-appearing MSK:  Neurovascularly intact       ASSESSMENT & PLAN:   Lumbar radiculopathy Getting improvement.  Having more stiffness in the lower part of the back and when she sits upright. -Counseled on home exercise therapy and supportive care. -Switch from naproxen to meloxicam. -Referral to physical therapy. -consider further imaging.

## 2021-12-06 NOTE — Assessment & Plan Note (Signed)
Getting improvement.  Having more stiffness in the lower part of the back and when she sits upright. -Counseled on home exercise therapy and supportive care. -Switch from naproxen to meloxicam. -Referral to physical therapy. -consider further imaging.

## 2022-01-15 ENCOUNTER — Ambulatory Visit (HOSPITAL_BASED_OUTPATIENT_CLINIC_OR_DEPARTMENT_OTHER)
Admission: RE | Admit: 2022-01-15 | Discharge: 2022-01-15 | Disposition: A | Discharge status: Home / Self Care | Payer: 59 | Source: Ambulatory Visit | Attending: Family Medicine | Admitting: Family Medicine

## 2022-01-15 ENCOUNTER — Encounter (HOSPITAL_BASED_OUTPATIENT_CLINIC_OR_DEPARTMENT_OTHER): Payer: Self-pay

## 2022-01-15 ENCOUNTER — Encounter: Payer: Self-pay | Admitting: Family Medicine

## 2022-01-15 ENCOUNTER — Ambulatory Visit: Payer: 59 | Admitting: Family Medicine

## 2022-01-15 VITALS — BP 182/90 | Ht 65.0 in | Wt 312.0 lb

## 2022-01-15 DIAGNOSIS — M5416 Radiculopathy, lumbar region: Secondary | ICD-10-CM | POA: Diagnosis not present

## 2022-01-15 NOTE — Assessment & Plan Note (Signed)
Acute on chronic in nature.  Her symptoms been intermittent over the past 2 months.  She has started physical therapy.  She has been performed greater than 6 weeks of physician directed home exercise therapy.  She has tried medications. -Counseled on home exercise therapy and supportive care. -Continue physical therapy. -Provided work note. -X-ray. -MRI of the lumbar spine to evaluate for nerve impingement and the consideration of epidural use.

## 2022-01-15 NOTE — Patient Instructions (Signed)
Good to see you Please continue physical therapy  I will call with the xray results.  We'll get the MRi at [facility] imaging   Please send me a message in MyChart with any questions or updates.  We'll schedule a virtual visit once the MRI is resulted.   --Dr. Jordan Likes

## 2022-01-15 NOTE — Progress Notes (Signed)
  Elaine Miller - 26 y.o. female MRN [id number]  Date of birth: Dec 16, 1995  SUBJECTIVE:  Including CC & ROS.  No chief complaint on file.   Elaine Miller is a 26 y.o. female that is following up for her acute on chronic radicular pain.  She has started physical therapy and tried medications.  She still endorses the pain that she feels in the left gluteus.  Her left foot will have paresthesia from time to time.   Review of Systems See HPI   HISTORY: Past Medical, Surgical, Social, and Family History Reviewed & Updated per EMR.   Pertinent Historical Findings include:  Past Medical History:  Diagnosis Date   Asthma    Obesity    Seasonal allergies     History reviewed. No pertinent surgical history.   PHYSICAL EXAM:  VS: BP (!) 182/90 (BP Location: Left Arm, Patient Position: Sitting)   Ht 5\' 5"  (1.651 m)   Wt (!) 312 lb (141.5 kg)   BMI 51.92 kg/m  Physical Exam Gen: NAD, alert, cooperative with exam, well-appearing MSK:  Neurovascularly intact       ASSESSMENT & PLAN:   Lumbar radiculopathy Acute on chronic in nature.  Her symptoms been intermittent over the past 2 months.  She has started physical therapy.  She has been performed greater than 6 weeks of physician directed home exercise therapy.  She has tried medications. -Counseled on home exercise therapy and supportive care. -Continue physical therapy. -Provided work note. -X-ray. -MRI of the lumbar spine to evaluate for nerve impingement and the consideration of epidural use.

## 2022-01-16 ENCOUNTER — Ambulatory Visit: Payer: 59 | Admitting: Family Medicine

## 2022-01-20 ENCOUNTER — Encounter: Payer: Self-pay | Admitting: *Deleted

## 2022-01-25 ENCOUNTER — Other Ambulatory Visit: Payer: 59

## 2022-01-27 ENCOUNTER — Encounter: Payer: Self-pay | Admitting: Family Medicine

## 2022-02-02 ENCOUNTER — Ambulatory Visit
Admission: RE | Admit: 2022-02-02 | Discharge: 2022-02-02 | Disposition: A | Discharge status: Home / Self Care | Payer: 59 | Source: Ambulatory Visit | Attending: Family Medicine | Admitting: Family Medicine

## 2022-02-02 DIAGNOSIS — M5416 Radiculopathy, lumbar region: Secondary | ICD-10-CM

## 2022-02-05 ENCOUNTER — Telehealth (INDEPENDENT_AMBULATORY_CARE_PROVIDER_SITE_OTHER): Payer: 59 | Admitting: Family Medicine

## 2022-02-05 DIAGNOSIS — M5416 Radiculopathy, lumbar region: Secondary | ICD-10-CM

## 2022-02-05 NOTE — Assessment & Plan Note (Signed)
Doing well with no significant pain at this time.  She does notice that when she sits or moves a certain way. -Counseled on home exercise therapy and supportive care. -Continue physical therapy. -Could consider epidural.

## 2022-02-05 NOTE — Progress Notes (Signed)
Virtual Visit via Telephone Note  I connected with Elaine Miller on 02/05/22 at  1:00 PM EDT by telephone and verified that I am speaking with the correct person using two identifiers.  Location: Patient: work  Provider: office   I discussed the limitations, risks, security and privacy concerns of performing an evaluation and management service by telephone and the availability of in person appointments. I also discussed with the patient that there may be a patient responsible charge related to this service. The patient expressed understanding and agreed to proceed.   History of Present Illness:  Elaine Miller is a 26 yo that is following up after the MRI of her lumbar spine.  This was demonstrating congenital narrowing of the spinal canal without significant superimposed degenerative changes and no foraminal stenosis.   Observations/Objective:   Assessment and Plan:  Lumbar radiculopathy: Doing well with no significant pain at this time.  She does notice that when she sits or moves a certain way. -Counseled on home exercise therapy and supportive care. -Continue physical therapy. -Could consider epidural.  Follow Up Instructions:    I discussed the assessment and treatment plan with the patient. The patient was provided an opportunity to ask questions and all were answered. The patient agreed with the plan and demonstrated an understanding of the instructions.   The patient was advised to call back or seek an in-person evaluation if the symptoms worsen or if the condition fails to improve as anticipated.  I provided 6 minutes of non-face-to-face time during this encounter.   Clare Gandy, MD

## 2022-02-17 IMAGING — DX DG ANKLE COMPLETE 3+V*L*
3 series · 3 of 3 positions shown · non-contrast
Comparison: Report from 06/08/2007

CLINICAL DATA: Lateral left ankle pain and swelling after jumping
rope last night.

EXAM:
LEFT ANKLE COMPLETE - 3+ VIEW

[ankle ap]
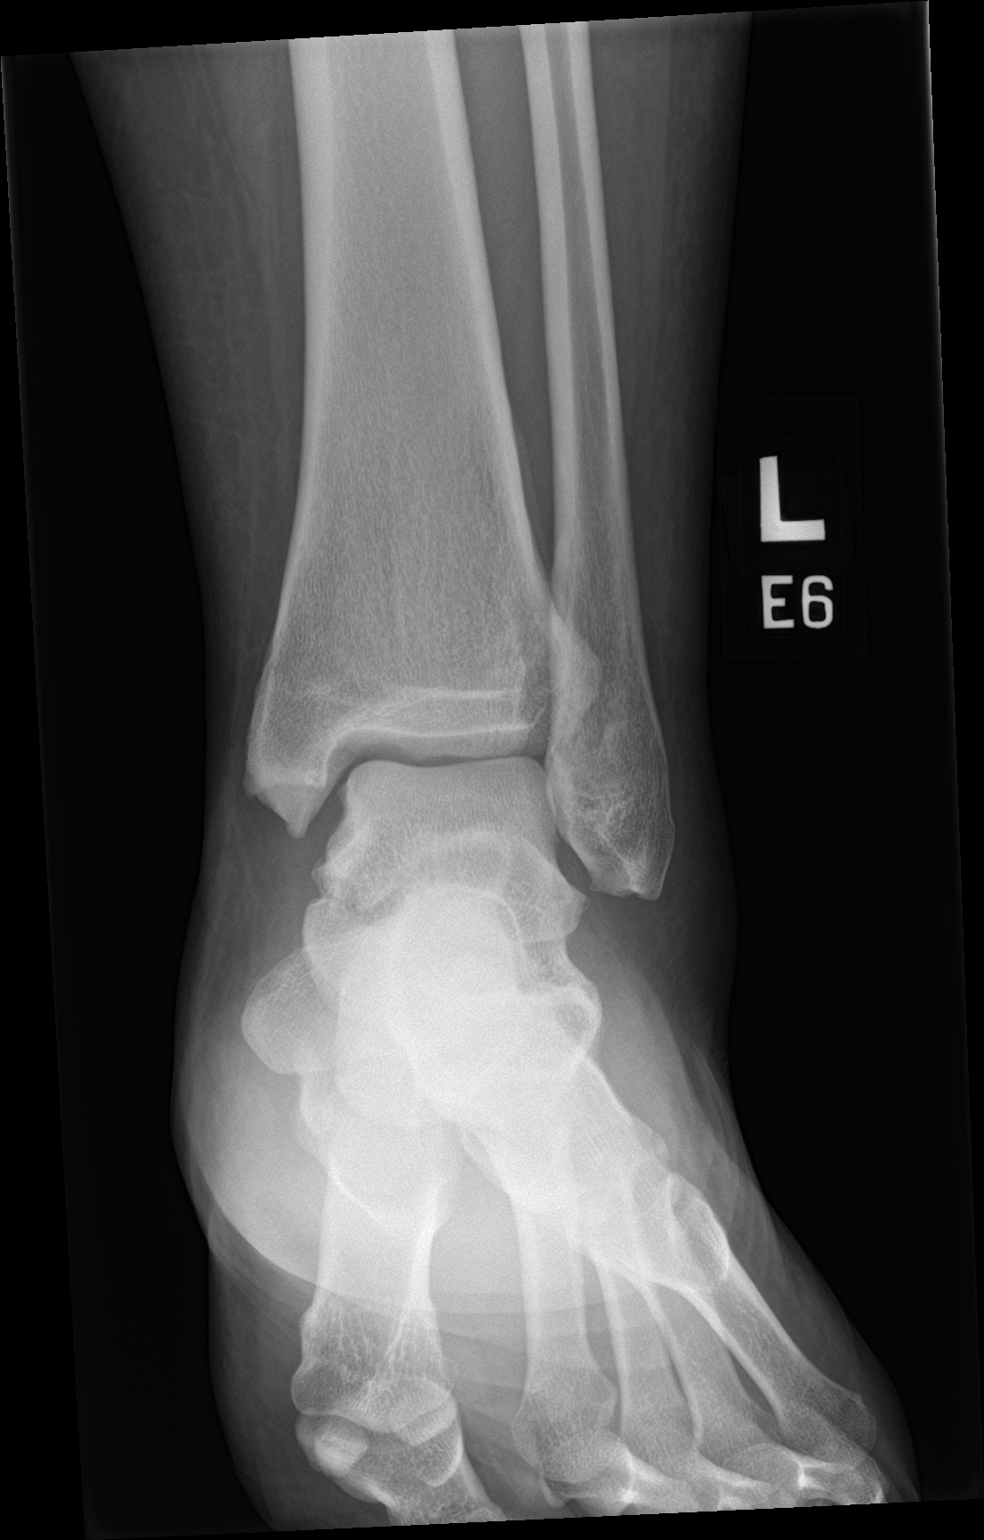

[ankle obl]
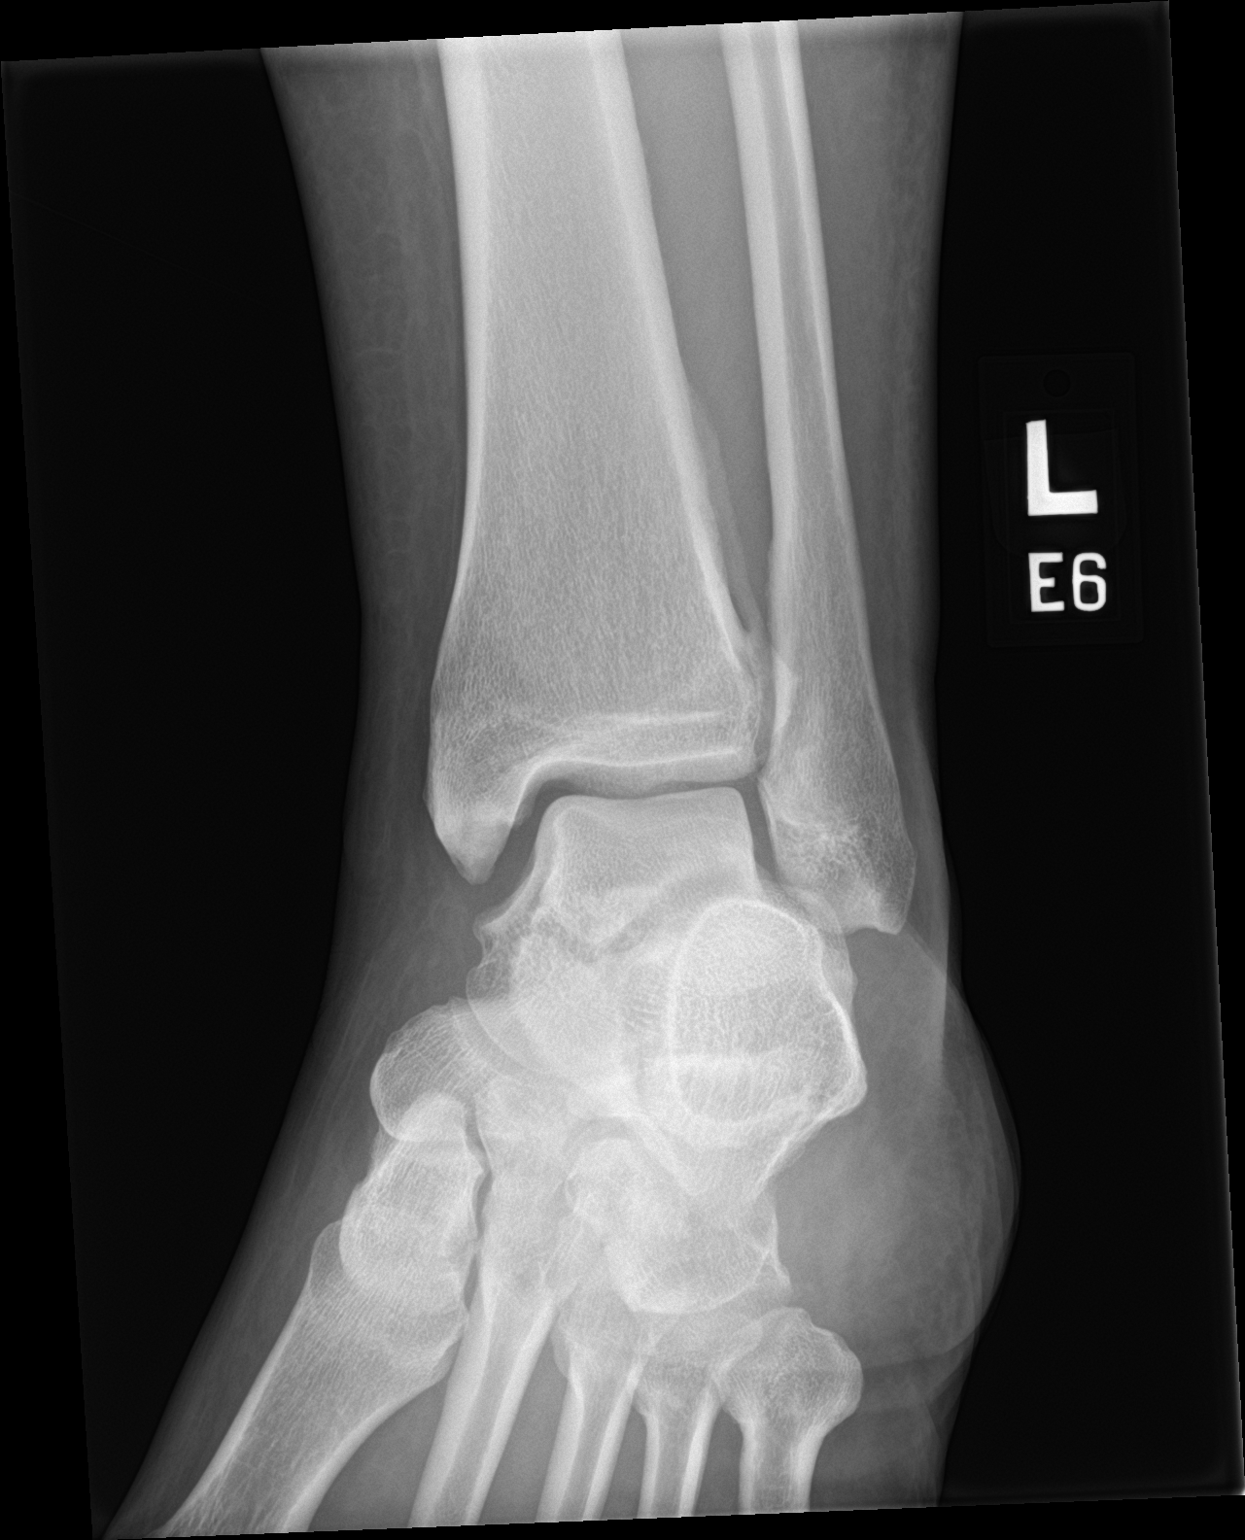

[ankle lat]
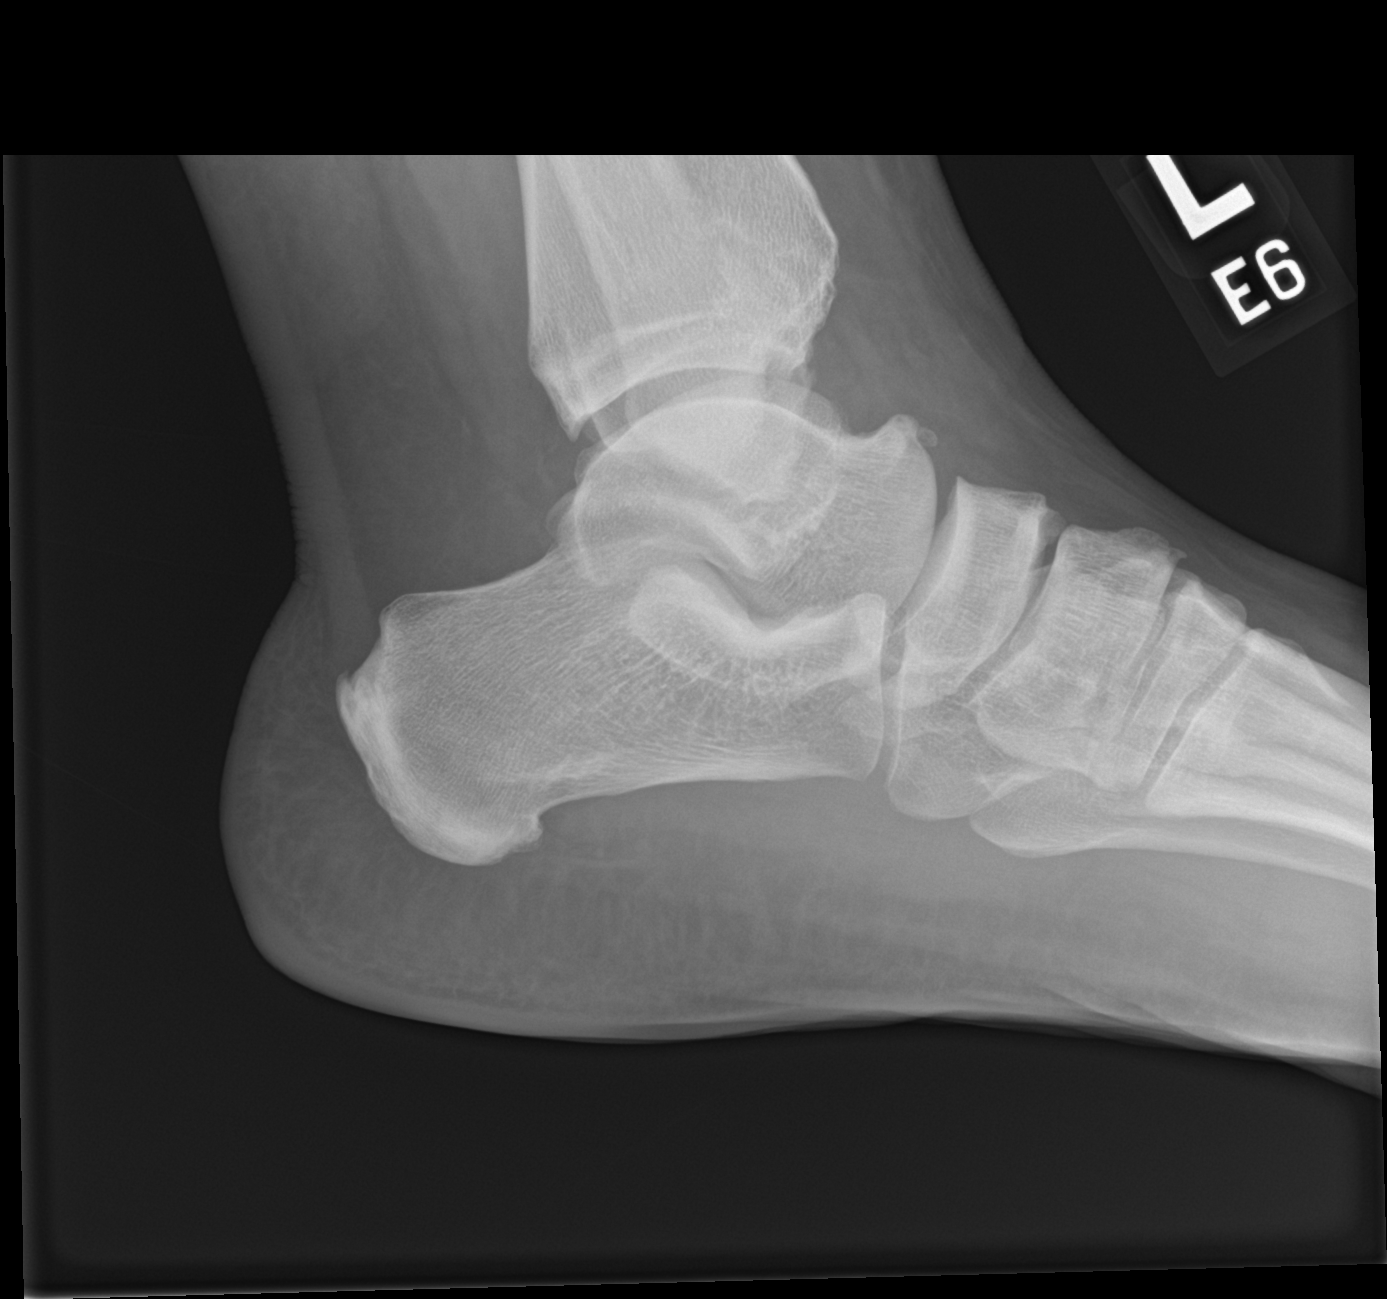

[3 of 3 positions shown; findings below may reference images not displayed]

FINDINGS: No acute fracture identified. Bony ridging along the posterolateral
tibial likely related to healed old fracture reported on 06/08/2007.
Mild spurring of the distal distal tibial rim posteriorly. There is
dorsal spurring of the talar head along with some mild dorsal
spurring in the midfoot. Small plantar and Achilles calcaneal spurs
are present. No definite tibiotalar joint effusion. Equivocal soft
tissue edema below the lateral malleolus.
IMPRESSION: 1. No acute bony findings.
2. Equivocal soft tissue swelling below the lateral malleolus.
3. Mild but advanced for age degenerative findings in the hindfoot
and midfoot. Plantar calcaneal spurs noted.
4. If pain persists despite conservative therapy, MRI may be
warranted for further characterization.

## 2022-03-05 ENCOUNTER — Encounter: Payer: Self-pay | Admitting: Family Medicine

## 2022-03-11 ENCOUNTER — Encounter: Payer: Self-pay | Admitting: Family Medicine

## 2022-03-11 ENCOUNTER — Telehealth (INDEPENDENT_AMBULATORY_CARE_PROVIDER_SITE_OTHER): Payer: 59 | Admitting: Family Medicine

## 2022-03-11 DIAGNOSIS — M5416 Radiculopathy, lumbar region: Secondary | ICD-10-CM

## 2022-03-11 NOTE — Assessment & Plan Note (Signed)
She has gotten improvement with physical therapy.  Continues to have pain if she sits for an extended period of time.  She has spinal canal stenosis. -Counseled on home exercise therapy and supportive care. -Epidural.

## 2022-03-11 NOTE — Progress Notes (Signed)
Virtual Visit via Video Note  I connected with Elaine Miller on 03/11/22 at  9:30 AM EDT by a video enabled telemedicine application and verified that I am speaking with the correct person using two identifiers.  Location: Patient: work Provider: home   I discussed the limitations of evaluation and management by telemedicine and the availability of in person appointments. The patient expressed understanding and agreed to proceed.  History of Present Illness:  Elaine Miller is a 26 year old female that is following up for her low back pain.  She has done well with physical therapy.  She still endorses lower back pain when she sits for any length of time.  She is more agile and able to exercise like she was able to previously.  Observations/Objective:   Assessment and Plan:  Lumbar radiculopathy. She has gotten improvement with physical therapy.  Continues to have pain if she sits for an extended period of time.  She has spinal canal stenosis. -Counseled on home exercise therapy and supportive care. -Epidural.  Follow Up Instructions:    I discussed the assessment and treatment plan with the patient. The patient was provided an opportunity to ask questions and all were answered. The patient agreed with the plan and demonstrated an understanding of the instructions.   The patient was advised to call back or seek an in-person evaluation if the symptoms worsen or if the condition fails to improve as anticipated.   Clare Gandy, MD

## 2022-03-19 ENCOUNTER — Encounter: Payer: Self-pay | Admitting: Family Medicine

## 2022-03-26 ENCOUNTER — Inpatient Hospital Stay
Admission: RE | Admit: 2022-03-26 | Discharge: 2022-03-26 | Disposition: A | Discharge status: Home / Self Care | Payer: 59 | Source: Ambulatory Visit | Attending: Family Medicine | Admitting: Family Medicine

## 2022-03-26 NOTE — Discharge Instructions (Signed)

## 2022-07-21 ENCOUNTER — Encounter: Payer: Self-pay | Admitting: Family Medicine

## 2022-07-28 ENCOUNTER — Ambulatory Visit (INDEPENDENT_AMBULATORY_CARE_PROVIDER_SITE_OTHER): Payer: 59 | Admitting: Family Medicine

## 2022-07-28 ENCOUNTER — Encounter: Payer: Self-pay | Admitting: Family Medicine

## 2022-07-28 VITALS — BP 128/68 | Ht 65.0 in | Wt 330.0 lb

## 2022-07-28 DIAGNOSIS — M5416 Radiculopathy, lumbar region: Secondary | ICD-10-CM

## 2022-07-28 MED ORDER — METHOCARBAMOL 500 MG PO TABS: 500.0000 mg | ORAL_TABLET | Freq: Three times a day (TID) | ORAL | 1 refills | Status: DC | PRN

## 2022-07-28 MED ORDER — NAPROXEN 500 MG PO TABS: 500.0000 mg | ORAL_TABLET | Freq: Two times a day (BID) | ORAL | 3 refills | Status: AC | PRN

## 2022-07-28 NOTE — Patient Instructions (Signed)
Good to see you Please continue with the exercises  Please use heat as needed  Please send me a message in MyChart with any questions or updates.  Please see me back in 3 months.   --Dr. Raeford Razor

## 2022-07-28 NOTE — Assessment & Plan Note (Signed)
Acute on chronic in nature.  She does well with working from home.  Pain seems to be worse with prolonged sitting or standing.  Has not completed an epidural yet. -Counseled on home exercise therapy and supportive care. -Naproxen and Robaxin. -Could consider epidural.

## 2022-07-28 NOTE — Progress Notes (Signed)
  Elaine Miller - 27 y.o. female MRN [id number]  Date of birth: 1995/09/11  SUBJECTIVE:  Including CC & ROS.  No chief complaint on file.   Elaine Miller is a 27 y.o. female that is following up for acute on chronic low back pain with radicular symptoms.  Symptoms been ongoing since last May.  She has completed physical therapy.  The pain is intermittent through the course of the day.  It seems to be worse with prolonged sitting or standing.  She has been more productive with being able to work from home.    Review of Systems See HPI   HISTORY: Past Medical, Surgical, Social, and Family History Reviewed & Updated per EMR.   Pertinent Historical Findings include:  Past Medical History:  Diagnosis Date   Asthma    Obesity    Seasonal allergies     History reviewed. No pertinent surgical history.   PHYSICAL EXAM:  VS: BP 128/68   Ht 5\' 5"  (1.651 m)   Wt (!) 330 lb (149.7 kg)   BMI 54.91 kg/m  Physical Exam Gen: NAD, alert, cooperative with exam, well-appearing MSK:  Neurovascularly intact       ASSESSMENT & PLAN:   Lumbar radiculopathy Acute on chronic in nature.  She does well with working from home.  Pain seems to be worse with prolonged sitting or standing.  Has not completed an epidural yet. -Counseled on home exercise therapy and supportive care. -Naproxen and Robaxin. -Could consider epidural.

## 2022-07-29 ENCOUNTER — Encounter: Payer: Self-pay | Admitting: Family Medicine

## 2022-09-25 ENCOUNTER — Ambulatory Visit: Payer: 59 | Admitting: Family Medicine

## 2022-09-26 ENCOUNTER — Ambulatory Visit: Payer: 59 | Admitting: Family Medicine

## 2022-10-02 ENCOUNTER — Ambulatory Visit: Payer: 59 | Admitting: Family Medicine

## 2022-10-13 ENCOUNTER — Encounter: Payer: Self-pay | Admitting: *Deleted

## 2022-10-15 ENCOUNTER — Other Ambulatory Visit: Payer: Self-pay | Admitting: Family Medicine

## 2022-11-03 ENCOUNTER — Encounter: Payer: Self-pay | Admitting: Family Medicine

## 2022-11-14 ENCOUNTER — Other Ambulatory Visit: Payer: Self-pay | Admitting: Family Medicine

## 2022-12-25 ENCOUNTER — Ambulatory Visit: Payer: 59 | Admitting: Family Medicine

## 2023-01-04 IMAGING — DX DG HIP (WITH OR WITHOUT PELVIS) 2-3V*L*
3 series · 3 of 3 positions shown · non-contrast
Comparison: None Available.

CLINICAL DATA: Left hip pain x2 days

EXAM:
DG HIP (WITH OR WITHOUT PELVIS) 2-3V LEFT

[pelvis ap]
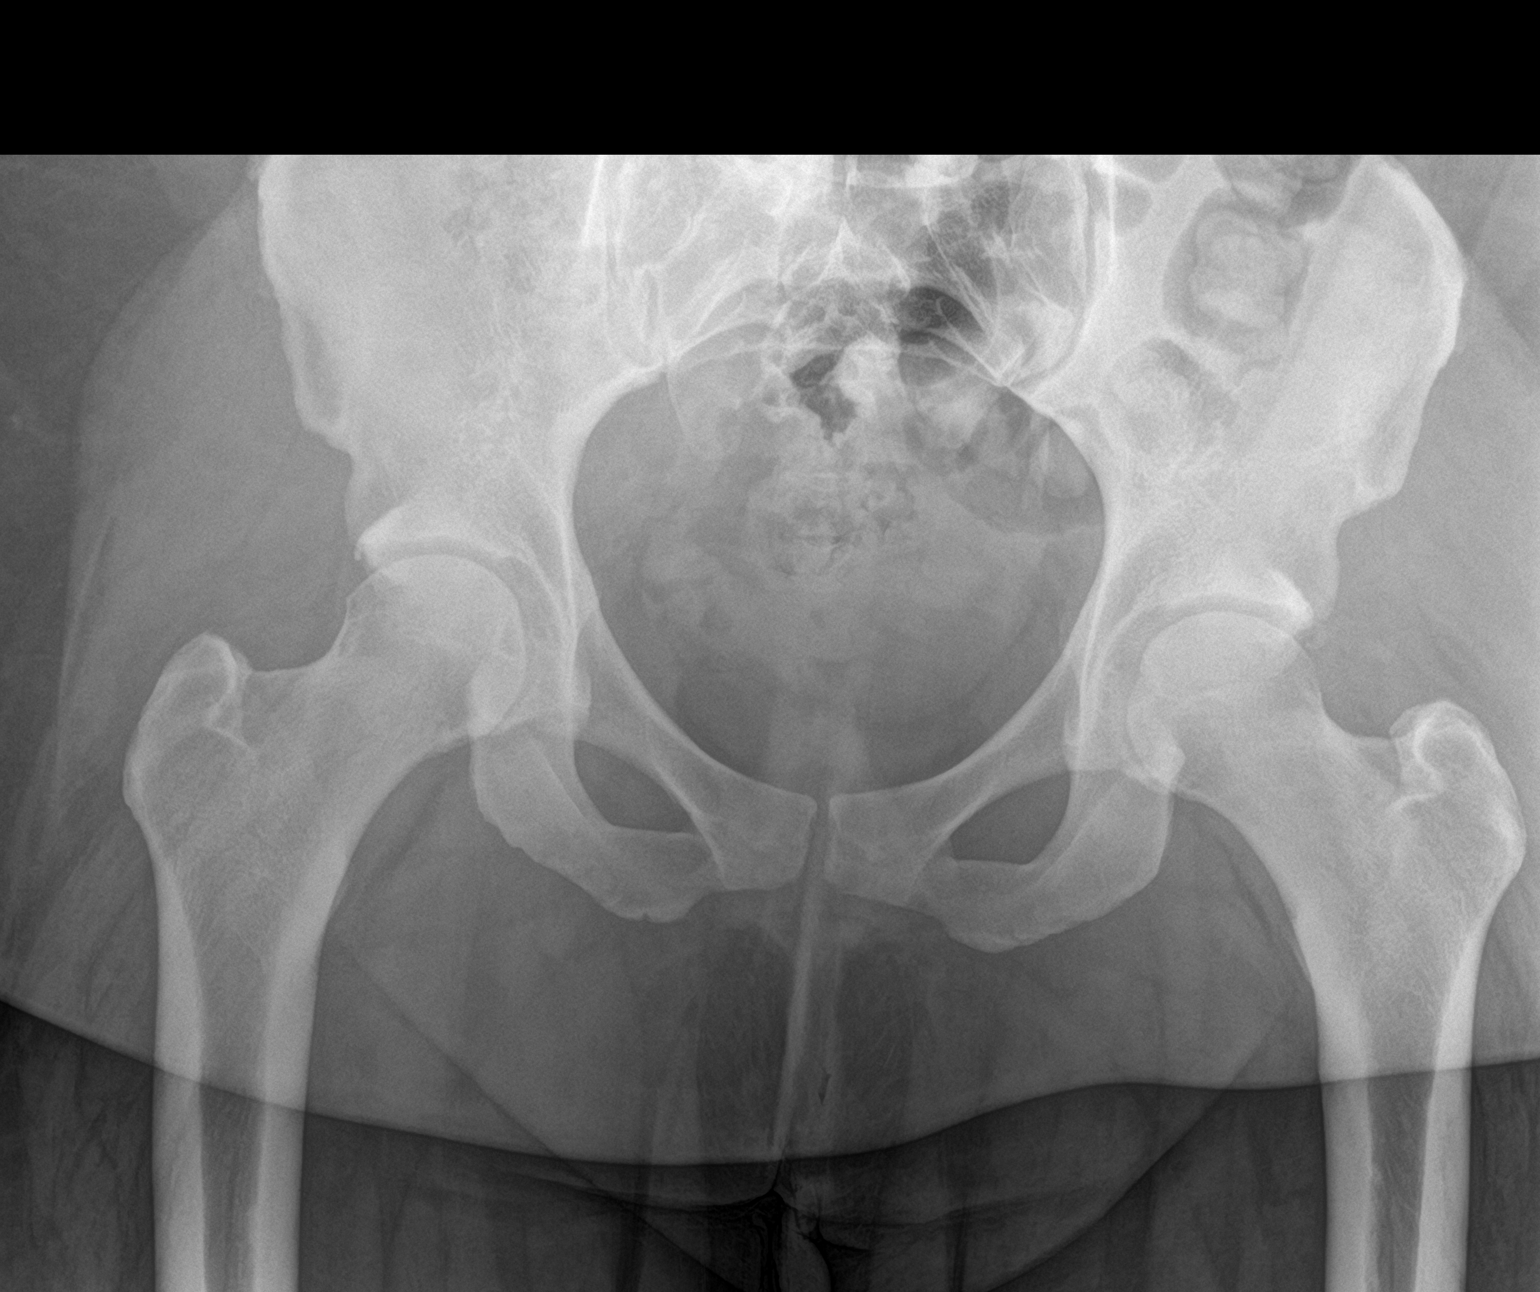

[hip ap]
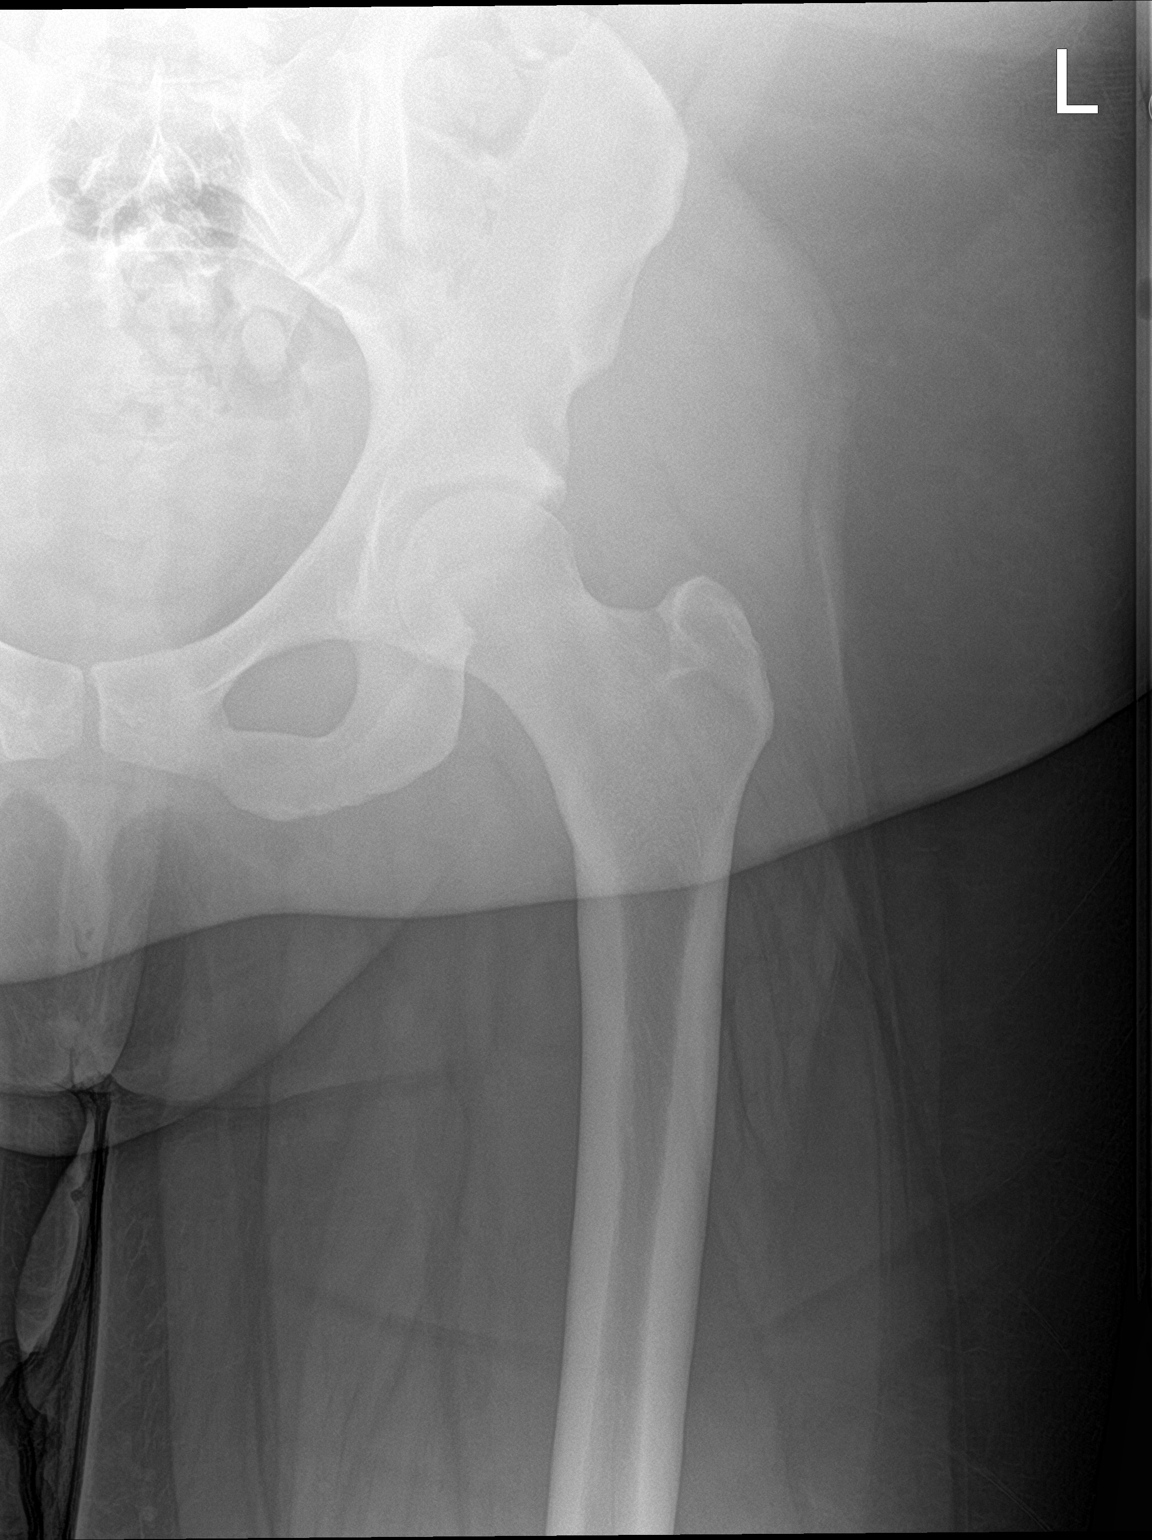

[hip lat]
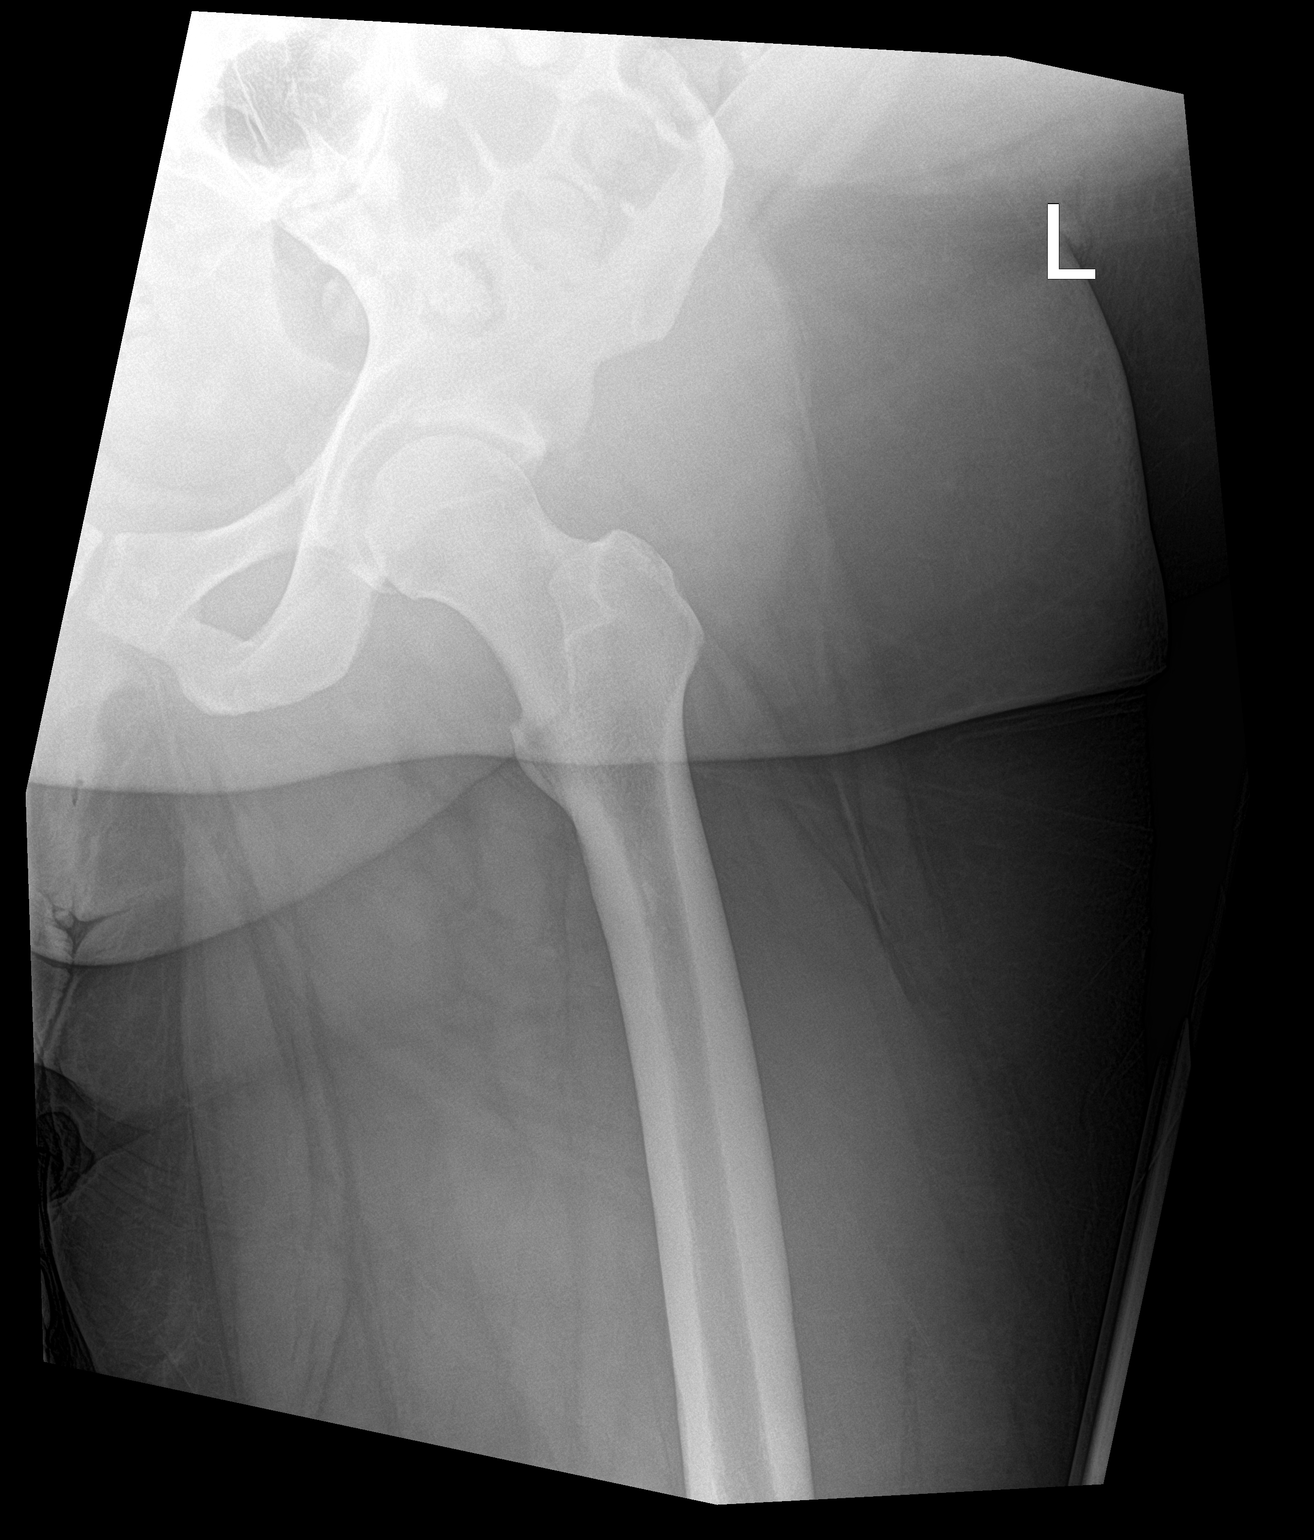

[3 of 3 positions shown; findings below may reference images not displayed]

FINDINGS: No fracture or dislocation is seen.

Bilateral hip joint spaces are preserved.

Visualized bony pelvis appears intact.
IMPRESSION: Negative.

## 2023-02-05 ENCOUNTER — Encounter: Payer: Self-pay | Admitting: Family Medicine

## 2023-02-05 ENCOUNTER — Ambulatory Visit (INDEPENDENT_AMBULATORY_CARE_PROVIDER_SITE_OTHER): Payer: 59 | Admitting: Family Medicine

## 2023-02-05 ENCOUNTER — Ambulatory Visit: Payer: 59 | Admitting: Sports Medicine

## 2023-02-05 VITALS — BP 145/104 | Ht 65.0 in | Wt 320.0 lb

## 2023-02-05 DIAGNOSIS — M545 Low back pain, unspecified: Secondary | ICD-10-CM

## 2023-02-05 DIAGNOSIS — G8929 Other chronic pain: Secondary | ICD-10-CM | POA: Diagnosis not present

## 2023-02-05 NOTE — Patient Instructions (Signed)
Ok to take tylenol for baseline pain relief (1-2 extra strength tabs 3x/day) Take naproxen twice a day with food for pain and inflammation. Robaxin as needed for muscle spasms (no driving on this medicine if it makes you sleepy). Stay as active as possible. Do home exercises and stretches as directed - hold each for 20-30 seconds and do each one three times. Start physical therapy even if you go every other week this will help. Strengthening of low back muscles, abdominal musculature are key for long term pain relief. Continue working on activity and weight loss though weightlifting only do body weight or light weight and not on back-to-back days. Send the paperwork via mychart or fax. Follow up with me in 6 weeks.

## 2023-02-05 NOTE — Progress Notes (Addendum)
PCP: Maye Hides, PA  Subjective:   HPI: Patient is a 27 y.o. female here for chronic low back pain.  Previously seen by Dr. Jordan Likes.  Last seen July 28, 2022.  Previously being treated for lumbar radiculopathy, minimal radicular symptoms today.  Since last visit she has been taking naproxen as needed and Robaxin every other day for pain.  Inconsistent with exercise for the past month, 2/2 24-hour recovery time from gym.  She has not received injections or epidural for her back.  She underwent PT for 1 month, unfortunately did not fully complete due to cost.  She felt PT was helpful.  Additionally, patient is undergoing weight loss management with PCP-currently on phentermine and plans to start topiramate.  Patient states she has limited ADLs, her mother is a partial caretaker often helping her clean the house and make food.  During last visit, patient was written for work from home until March 01, 2023.  Patient feels she has not made significant improvements and is requesting work from home accommodations today.  We had extensive conversation over work restriction and rehabilitation goals.   Past Medical History:  Diagnosis Date   Asthma    Obesity    Seasonal allergies     Current Outpatient Medications on File Prior to Visit  Medication Sig Dispense Refill   hydrochlorothiazide (HYDRODIURIL) 25 MG tablet Take 1 tablet (25 mg total) by mouth daily. 30 tablet 0   HYDROcodone-acetaminophen (NORCO) 5-325 MG tablet Take 1 tablet by mouth every 6 (six) hours as needed for severe pain. 8 tablet 0   meclizine (ANTIVERT) 25 MG tablet Take 1 tablet (25 mg total) by mouth 3 (three) times daily as needed for dizziness. 30 tablet 0   methocarbamol (ROBAXIN) 500 MG tablet TAKE 1 TABLET BY MOUTH EVERY 8 HOURS AS NEEDED FOR MUSCLE SPASM 90 tablet 0   naproxen (NAPROSYN) 500 MG tablet Take 1 tablet (500 mg total) by mouth 2 (two) times daily as needed. 60 tablet 3   pantoprazole (PROTONIX)  40 MG tablet Take twice daily for 3 months and then return to once daily.     No current facility-administered medications on file prior to visit.    No past surgical history on file.  Allergies  Allergen Reactions   Augmentin [Amoxicillin-Pot Clavulanate] Rash    BP (!) 145/104   Ht 5\' 5"  (1.651 m)   Wt (!) 320 lb (145.2 kg)   BMI 53.25 kg/m       No data to display              No data to display              Objective:  Physical Exam:  Gen: NAD, comfortable in exam room  Back: Limited exam 2/2 body habitus.  No point tenderness over spinous processes.  Point tenderness over bilateral low back extensors and neck extensors. FROM. Normal sensation. Straight leg negative for radiculopathy bilaterally. Pain with extension of back, no radicular symptoms. Negative logroll bilateral hips.   Assessment & Plan:  1. Chronic low back pain: 2/2 postural muscle pain, obesity.  Reviewed MRI from last year, still has space within spinal canal-low suspicion for symptomatic spinal stenosis and nerve impingement. Referral to PT, and home exercise plan given today. Continue weight loss management with PCP. Will continue 3 months of work from home- then plan for return to work with accommodations. Follow-up in 3 months.

## 2023-07-14 ENCOUNTER — Emergency Department (HOSPITAL_BASED_OUTPATIENT_CLINIC_OR_DEPARTMENT_OTHER): Payer: 59

## 2023-07-14 ENCOUNTER — Encounter (HOSPITAL_BASED_OUTPATIENT_CLINIC_OR_DEPARTMENT_OTHER): Payer: Self-pay

## 2023-07-14 ENCOUNTER — Emergency Department (HOSPITAL_BASED_OUTPATIENT_CLINIC_OR_DEPARTMENT_OTHER)
Admission: EM | Admit: 2023-07-14 | Discharge: 2023-07-14 | Disposition: A | Discharge status: Home / Self Care | Payer: 59 | Attending: Emergency Medicine | Admitting: Emergency Medicine

## 2023-07-14 ENCOUNTER — Other Ambulatory Visit: Payer: Self-pay

## 2023-07-14 DIAGNOSIS — R42 Dizziness and giddiness: Secondary | ICD-10-CM | POA: Diagnosis present

## 2023-07-14 DIAGNOSIS — J45909 Unspecified asthma, uncomplicated: Secondary | ICD-10-CM | POA: Insufficient documentation

## 2023-07-14 DIAGNOSIS — Z79899 Other long term (current) drug therapy: Secondary | ICD-10-CM | POA: Insufficient documentation

## 2023-07-14 DIAGNOSIS — I10 Essential (primary) hypertension: Secondary | ICD-10-CM | POA: Insufficient documentation

## 2023-07-14 HISTORY — DX: Essential (primary) hypertension: I10

## 2023-07-14 LAB — BASIC METABOLIC PANEL
Anion gap: 10 (ref 5–15)
BUN: 15 mg/dL (ref 6–20)
CO2: 22 mmol/L (ref 22–32)
Calcium: 9.2 mg/dL (ref 8.9–10.3)
Chloride: 105 mmol/L (ref 98–111)
Creatinine, Ser: 0.79 mg/dL (ref 0.44–1.00)
GFR, Estimated: >60 mL/min (ref >60)
Glucose, Bld: 105 mg/dL — ABNORMAL HIGH (ref 70–99)
Potassium: 3.7 mmol/L (ref 3.5–5.1)
Sodium: 137 mmol/L (ref 135–145)

## 2023-07-14 LAB — PROTIME-INR
INR: 1 (ref 0.8–1.2)
Prothrombin Time: 13.1 s (ref 11.4–15.2)

## 2023-07-14 LAB — CBC
HCT: 43.7 % (ref 36.0–46.0)
Hemoglobin: 14.5 g/dL (ref 12.0–15.0)
MCH: 28.8 pg (ref 26.0–34.0)
MCHC: 33.2 g/dL (ref 30.0–36.0)
MCV: 86.9 fL (ref 80.0–100.0)
Platelets: 266 10*3/uL (ref 150–400)
RBC: 5.03 MIL/uL (ref 3.87–5.11)
RDW: 12.1 % (ref 11.5–15.5)
WBC: 6.3 10*3/uL (ref 4.0–10.5)
nRBC: 0 % (ref 0.0–0.2)

## 2023-07-14 LAB — APTT: aPTT: 26 s (ref 24–36)

## 2023-07-14 LAB — SALICYLATE LEVEL: Salicylate Lvl: <7 mg/dL — ABNORMAL LOW (ref 7.0–30.0)

## 2023-07-14 LAB — ACETAMINOPHEN LEVEL: Acetaminophen (Tylenol), Serum: <10 ug/mL — ABNORMAL LOW (ref 10–30)

## 2023-07-14 LAB — ETHANOL: Alcohol, Ethyl (B): <10 mg/dL (ref <10)

## 2023-07-14 NOTE — ED Provider Notes (Signed)
 [place] [facility] Provider Note   CSN: [id number] Arrival date & time: 07/14/23  1016     History  Chief Complaint  Patient presents with   Dizziness    Elaine Miller is a 28 y.o. female past medical history as needed for obesity, asthma, hypertension who presents with concern for dizziness, lightheadedness, questionable expressive aphasia.  Patient reports that she fell like she was get a pass out.  She feels out of her head.  She denies any headache.  She reports that she took her blood pressure medication this morning.  She denies any weakness, numbness, tingling.  No previous strokelike deficits.  She still feels like she is having difficulty finding her words but cannot give me a specific example.  She is alert and oriented x 3 on my exam.   Dizziness      Home Medications Prior to Admission medications   Medication Sig Start Date End Date Taking? Authorizing Provider  hydrochlorothiazide  (HYDRODIURIL ) 25 MG tablet Take 1 tablet (25 mg total) by mouth daily. 01/14/21   Emelia Sluder, PA-C  HYDROcodone -acetaminophen  (NORCO) 5-325 MG tablet Take 1 tablet by mouth every 6 (six) hours as needed for severe pain. 11/12/21   Molpus, John, MD  meclizine  (ANTIVERT ) 25 MG tablet Take 1 tablet (25 mg total) by mouth 3 (three) times daily as needed for dizziness. 01/14/21   Emelia Sluder, PA-C  methocarbamol  (ROBAXIN ) 500 MG tablet TAKE 1 TABLET BY MOUTH EVERY 8 HOURS AS NEEDED FOR MUSCLE SPASM 11/17/22   Chick Venetia BRAVO, MD  naproxen  (NAPROSYN ) 500 MG tablet Take 1 tablet (500 mg total) by mouth 2 (two) times daily as needed. 07/28/22 07/28/23  Chick Venetia BRAVO, MD  pantoprazole  (PROTONIX ) 40 MG tablet Take twice daily for 3 months and then return to once daily. 02/11/18   [provider]      Allergies    Augmentin [amoxicillin-pot clavulanate]    Review of Systems   Review of Systems  Neurological:  Positive for dizziness.  All other  systems reviewed and are negative.   Physical Exam Updated Vital Signs BP (!) 155/101   Pulse 87   Temp 97.9 F (36.6 C) (Oral)   Resp 16   Wt (!) 145.2 kg   SpO2 99%   BMI 53.25 kg/m  Physical Exam Vitals and nursing note reviewed.  Constitutional:      General: She is not in acute distress.    Appearance: Normal appearance.  HENT:     Head: Normocephalic and atraumatic.  Eyes:     General:        Right eye: No discharge.        Left eye: No discharge.  Cardiovascular:     Rate and Rhythm: Normal rate and regular rhythm.     Heart sounds: No murmur heard.    No friction rub. No gallop.  Pulmonary:     Effort: Pulmonary effort is normal.     Breath sounds: Normal breath sounds.  Abdominal:     General: Bowel sounds are normal.     Palpations: Abdomen is soft.  Skin:    General: Skin is warm and dry.     Capillary Refill: Capillary refill takes less than 2 seconds.  Neurological:     Mental Status: She is alert and oriented to person, place, and time.     Comments: Cranial nerves II through XII grossly intact.  Intact finger-nose, intact heel-to-shin.  Romberg negative, gait  normal.  Alert and oriented x3.  Moves all 4 limbs spontaneously, normal coordination.  No pronator drift.  Intact strength 5 out of 5 bilateral upper and lower extremities.   Psychiatric:        Mood and Affect: Mood normal.        Behavior: Behavior normal.     ED Results / Procedures / Treatments   Labs (all labs ordered are listed, but only abnormal results are displayed) Labs Reviewed  BASIC METABOLIC PANEL - Abnormal; Notable for the following components:      Result Value   Glucose, Bld 105 (*)    All other components within normal limits  SALICYLATE LEVEL - Abnormal; Notable for the following components:   Salicylate Lvl <7.0 (*)    All other components within normal limits  ACETAMINOPHEN  LEVEL - Abnormal; Notable for the following components:   Acetaminophen  (Tylenol ), Serum <10  (*)    All other components within normal limits  CBC  ETHANOL  PROTIME-INR  APTT  URINALYSIS, ROUTINE W REFLEX MICROSCOPIC  RAPID URINE DRUG SCREEN, HOSP PERFORMED    EKG EKG Interpretation Date/Time:  Tuesday July 14 2023 10:39:09 EST Ventricular Rate:  74 PR Interval:  152 QRS Duration:  91 QT Interval:  395 QTC Calculation: 439 R Axis:   45  Text Interpretation: Sinus rhythm Confirmed by Ruthe Cornet [phone]) on 07/14/2023 12:34:24 PM  Radiology DG Chest Portable 1 View Result Date: 07/14/2023 CLINICAL DATA:  Altered mental status.  Dizziness. EXAM: PORTABLE CHEST 1 VIEW COMPARISON:  07/03/2016. FINDINGS: Bilateral lung fields are clear. Bilateral costophrenic angles are clear. Normal cardio-mediastinal silhouette. No acute osseous abnormalities. The soft tissues are within normal limits. IMPRESSION: No active disease. Electronically Signed   By: Ree Molt M.D.   On: 07/14/2023 11:40   CT Head Wo Contrast Result Date: 07/14/2023 CLINICAL DATA:  Mental status change, unknown cause EXAM: CT HEAD WITHOUT CONTRAST TECHNIQUE: Contiguous axial images were obtained from the base of the skull through the vertex without intravenous contrast. RADIATION DOSE REDUCTION: This exam was performed according to the departmental dose-optimization program which includes automated exposure control, adjustment of the mA and/or kV according to patient size and/or use of iterative reconstruction technique. COMPARISON:  None Available. FINDINGS: Brain: No evidence of acute infarction, hemorrhage, hydrocephalus, extra-axial collection or mass lesion/mass effect. Vascular: No hyperdense vessel. Skull: No acute fracture. Sinuses/Orbits: Clear sinuses.  No acute orbital findings. IMPRESSION: No evidence of acute intracranial abnormality. Electronically Signed   By: Gilmore GORMAN Molt M.D.   On: 07/14/2023 11:24    Procedures Procedures    Medications Ordered in ED Medications - No data to  display  ED Course/ Medical Decision Making/ A&P                                 Medical Decision Making Amount and/or Complexity of Data Reviewed Labs: ordered. Radiology: ordered.   This patient is a 28 y.o. female  who presents to the ED for concern of dizziness, ams.   Differential diagnoses prior to evaluation: The emergent differential diagnosis includes, but is not limited to,  CVA, seizure, hypotension, sepsis, hypoglycemia, hypoxic encephalopathy, metabolic encephalopathy, polypharmacy, substance abuse, developing dementia or alzheimers, meningitis, encephalitis, hypertensive emergency, other systemic infection, acute alcohol intoxication, acute alcohol or other drug withdrawal or psychiatric manifestation vs other, BPPV, vestibular migraine, head trauma, AVM, intracranial tumor, multiple sclerosis, drug-related, vasovagal syncope, orthostatic hypotension, electrolyte  disturbance, anemia, anxiety/panic attack . This is not an exhaustive differential.   Past Medical History / Co-morbidities / Social History: Hypertension, obesity, asthma  Additional history: Chart reviewed. Pertinent results include: reviewed outpatient obgyn visits, sports medicine visits  Physical Exam: Physical exam performed. The pertinent findings include: Cranial nerves II through XII grossly intact.  Intact finger-nose, intact heel-to-shin.  Romberg negative, gait normal.  Alert and oriented x3.  Moves all 4 limbs spontaneously, normal coordination.  No pronator drift.  Intact strength 5 out of 5 bilateral upper and lower extremities.  Really quite hypertensive, BP 189/116.  Her repeat blood pressure was 155/101.  She is afebrile, not tachycardic.  Lab Tests/Imaging studies: I personally interpreted labs/imaging and the pertinent results include: CBC unremarkable, unremarkable, PT/INR, ethanol, salicylate, Tylenol  level all unremarkable, I do feel interphalangeal chest x-ray as well as CT head without  contrast which shows no evidence of acute intracranial or intrathoracic abnormality. I agree with the radiologist interpretation.  Cardiac monitoring: EKG obtained and interpreted by myself and attending physician which shows: NSR, no acute st-T changes   Medications: Encourage fluids, rest for dizziness, near syncope, low clinical suspicion for atypical migraine, stroke, no numbness, tingling, vision changes to suggest atypical MS presentation.   Disposition: After consideration of the diagnostic results and the patients response to treatment, I feel that patient stable for discharge, encourage close PCP follow-up, return precautions given.  She remained somewhat hypertensive, blood pressure significantly improved on recheck, last value while in the room evaluating patient prior to discharge 146/96.SABRA   emergency department workup does not suggest an emergent condition requiring admission or immediate intervention beyond what has been performed at this time. The plan is: as above. The patient is safe for discharge and has been instructed to return immediately for worsening symptoms, change in symptoms or any other concerns.  Final Clinical Impression(s) / ED Diagnoses Final diagnoses:  Dizziness  Lightheadedness    Rx / DC Orders ED Discharge Orders     None         Rosan Sherlean DEL, PA-C 07/14/23 1244    Ruthe Cornet, DO 07/14/23 1253

## 2023-07-14 NOTE — ED Triage Notes (Signed)
 States started having dizziness at 0845 with intermittent expressive aphasia. States feels like she is going to pass out at time. Feels out of her head. Denies headache, hx of hypertension, takes hydrochlorothiazide .   No weakness noted. Able to speak in full sentence but at times pauses to think of words.

## 2023-07-14 NOTE — Discharge Instructions (Signed)
 Please drink plenty of fluids, take some time to rest, follow-up closely with your primary care doctor to recheck symptoms and discuss whether any of your new medications could be contributing.  I did not see any worrisome abnormalities in your emergency department workup today to explain your symptoms, no evidence of stroke, no critical anemia, no electrolyte abnormalities.

## 2023-08-24 ENCOUNTER — Encounter: Payer: Self-pay | Admitting: Family Medicine
